# Patient Record
Sex: Male | Born: 1955 | Race: Black or African American | Hispanic: No | Marital: Married | Smoking: Never smoker
Health system: Southern US, Community
[De-identification: ages and names within clinical notes are randomized; demographics above are authoritative.]

## PROBLEM LIST (undated history)

## (undated) DIAGNOSIS — E7800 Pure hypercholesterolemia, unspecified: Secondary | ICD-10-CM

## (undated) DIAGNOSIS — F409 Phobic anxiety disorder, unspecified: Secondary | ICD-10-CM

## (undated) DIAGNOSIS — Z87442 Personal history of urinary calculi: Secondary | ICD-10-CM

## (undated) DIAGNOSIS — G709 Myoneural disorder, unspecified: Secondary | ICD-10-CM

## (undated) DIAGNOSIS — N529 Male erectile dysfunction, unspecified: Secondary | ICD-10-CM

## (undated) DIAGNOSIS — K219 Gastro-esophageal reflux disease without esophagitis: Secondary | ICD-10-CM

## (undated) DIAGNOSIS — I10 Essential (primary) hypertension: Secondary | ICD-10-CM

## (undated) DIAGNOSIS — G2581 Restless legs syndrome: Secondary | ICD-10-CM

## (undated) DIAGNOSIS — G4700 Insomnia, unspecified: Secondary | ICD-10-CM

## (undated) HISTORY — DX: Restless legs syndrome: G25.81

## (undated) HISTORY — DX: Male erectile dysfunction, unspecified: N52.9

## (undated) HISTORY — DX: Myoneural disorder, unspecified: G70.9

## (undated) HISTORY — PX: KIDNEY STONE SURGERY: SHX686

## (undated) HISTORY — PX: COLONOSCOPY: SHX174

## (undated) HISTORY — DX: Insomnia, unspecified: G47.00

---

## 2002-11-29 ENCOUNTER — Emergency Department (HOSPITAL_COMMUNITY): Admission: EM | Admit: 2002-11-29 | Discharge: 2002-11-29 | Payer: Self-pay | Admitting: Emergency Medicine

## 2002-11-29 ENCOUNTER — Encounter: Payer: Self-pay | Admitting: Emergency Medicine

## 2002-12-30 ENCOUNTER — Ambulatory Visit (HOSPITAL_BASED_OUTPATIENT_CLINIC_OR_DEPARTMENT_OTHER): Admission: RE | Admit: 2002-12-30 | Discharge: 2002-12-30 | Payer: Self-pay | Admitting: Urology

## 2012-12-17 ENCOUNTER — Encounter (HOSPITAL_COMMUNITY): Payer: Self-pay | Admitting: *Deleted

## 2012-12-17 ENCOUNTER — Emergency Department (HOSPITAL_COMMUNITY)
Admission: EM | Admit: 2012-12-17 | Discharge: 2012-12-17 | Disposition: A | Discharge status: Home / Self Care | Payer: PRIVATE HEALTH INSURANCE | Source: Home / Self Care

## 2012-12-17 DIAGNOSIS — R059 Cough, unspecified: Secondary | ICD-10-CM

## 2012-12-17 DIAGNOSIS — J309 Allergic rhinitis, unspecified: Secondary | ICD-10-CM

## 2012-12-17 DIAGNOSIS — R0982 Postnasal drip: Secondary | ICD-10-CM

## 2012-12-17 DIAGNOSIS — R05 Cough: Secondary | ICD-10-CM

## 2012-12-17 HISTORY — DX: Essential (primary) hypertension: I10

## 2012-12-17 HISTORY — DX: Phobic anxiety disorder, unspecified: F40.9

## 2012-12-17 HISTORY — DX: Gastro-esophageal reflux disease without esophagitis: K21.9

## 2012-12-17 MED ORDER — PHENYLEPHRINE-CHLORPHEN-DM 10-4-12.5 MG/5ML PO LIQD: 5.0000 mL | ORAL | Status: DC | PRN

## 2012-12-17 NOTE — ED Notes (Addendum)
States he gets a cough when he is in air conditioning and night for past 3 weeks.  No fever.  Has had a little runny nose. No hx. of allergies.  Had slight sore throat for past 72 hrs.  No earache or headache.  Did not take his BP medication today because he has been taking cold medication.

## 2012-12-17 NOTE — ED Notes (Signed)
Pt. unable to do e-.signature due to computers being down when he was discharged.

## 2012-12-17 NOTE — Discharge Instructions (Signed)
Allergic Rhinitis Allergic rhinitis is when the mucous membranes in the nose respond to allergens. Allergens are particles in the air that cause your body to have an allergic reaction. This causes you to release allergic antibodies. Through a chain of events, these eventually cause you to release histamine into the blood stream (hence the use of antihistamines). Although meant to be protective to the body, it is this release that causes your discomfort, such as frequent sneezing, congestion and an itchy runny nose.  CAUSES  The pollen allergens may come from grasses, trees, and weeds. This is seasonal allergic rhinitis, or "hay fever." Other allergens cause year-round allergic rhinitis (perennial allergic rhinitis) such as house dust mite allergen, pet dander and mold spores.  SYMPTOMS   Nasal stuffiness (congestion).  Runny, itchy nose with sneezing and tearing of the eyes.  There is often an itching of the mouth, eyes and ears. It cannot be cured, but it can be controlled with medications. DIAGNOSIS  If you are unable to determine the offending allergen, skin or blood testing may find it. TREATMENT   Avoid the allergen.  Medications and allergy shots (immunotherapy) can help.  Hay fever may often be treated with antihistamines in pill or nasal spray forms. Antihistamines block the effects of histamine. There are over-the-counter medicines that may help with nasal congestion and swelling around the eyes. Check with your caregiver before taking or giving this medicine. If the treatment above does not work, there are many new medications your caregiver can prescribe. Stronger medications may be used if initial measures are ineffective. Desensitizing injections can be used if medications and avoidance fails. Desensitization is when a patient is given ongoing shots until the body becomes less sensitive to the allergen. Make sure you follow up with your caregiver if problems continue. SEEK MEDICAL  CARE IF:   You develop fever (more than 100.5 F (38.1 C).  You develop a cough that does not stop easily (persistent).  You have shortness of breath.  You start wheezing.  Symptoms interfere with normal daily activities. Document Released: 02/13/2001 Document Revised: 08/13/2011 Document Reviewed: 08/25/2008 ExitCare Patient Information 2014 [employer]. Cough, Adult  A cough is a reflex that helps clear your throat and airways. It can help heal the body or may be a reaction to an irritated airway. A cough may only last 2 or 3 weeks (acute) or may last more than 8 weeks (chronic).  CAUSES Acute cough:  Viral or bacterial infections. Chronic cough:  Infections.  Allergies.  Asthma.  Post-nasal drip.  Smoking.  Heartburn or acid reflux.  Some medicines.  Chronic lung problems (COPD).  Cancer. SYMPTOMS   Cough.  Fever.  Chest pain.  Increased breathing rate.  High-pitched whistling sound when breathing (wheezing).  Colored mucus that you cough up (sputum). TREATMENT   A bacterial cough may be treated with antibiotic medicine.  A viral cough must run its course and will not respond to antibiotics.  Your caregiver may recommend other treatments if you have a chronic cough. HOME CARE INSTRUCTIONS   Only take over-the-counter or prescription medicines for pain, discomfort, or fever as directed by your caregiver. Use cough suppressants only as directed by your caregiver.  Use a cold steam vaporizer or humidifier in your bedroom or home to help loosen secretions.  Sleep in a semi-upright position if your cough is worse at night.  Rest as needed.  Stop smoking if you smoke. SEEK IMMEDIATE MEDICAL CARE IF:   You have pus   in your sputum.  Your cough starts to worsen.  You cannot control your cough with suppressants and are losing sleep.  You begin coughing up blood.  You have difficulty breathing.  You develop pain which is getting worse or  is uncontrolled with medicine.  You have a fever. MAKE SURE YOU:   Understand these instructions.  Will watch your condition.  Will get help right away if you are not doing well or get worse. Document Released: 11/17/2010 Document Revised: 08/13/2011 Document Reviewed: 11/17/2010 ExitCare Patient Information 2014 [employer].  

## 2012-12-17 NOTE — ED Provider Notes (Signed)
History    CSN: [id number] Arrival date & time 12/17/12  1948  None    Chief Complaint  Patient presents with  . Cough   (Consider location/radiation/quality/duration/timing/severity/associated sxs/prior Treatment) HPI Comments: 57 year old male complaining of cough for 3 weeks. It occurs nearly exclusively while supine. He states when he sleeps in his air conditioned as well he has a mild discomfort in his throat it has some watery eyes. He complains of PND during the day. Denies headache or fever.  Patient is a 57 y.o. male presenting with cough.  Cough Associated symptoms: rhinorrhea and sore throat   Associated symptoms: no shortness of breath and no wheezing    Past Medical History  Diagnosis Date  . Hypertension   . GERD (gastroesophageal reflux disease)   . Phobic anxiety disorder     fear of driving over bridges   History reviewed. No pertinent past surgical history. Family History  Problem Relation Age of Onset  . COPD Father    History  Substance Use Topics  . Smoking status: Never Smoker   . Smokeless tobacco: Not on file  . Alcohol Use: No    Review of Systems  Constitutional: Negative.   HENT: Positive for congestion, sore throat, rhinorrhea and postnasal drip. Negative for neck pain and ear discharge.   Respiratory: Positive for cough. Negative for chest tightness, shortness of breath and wheezing.   Gastrointestinal: Negative.   Neurological: Negative.     Allergies  Review of patient's allergies indicates no known allergies.  Home Medications   Current Outpatient Rx  Name  Route  Sig  Dispense  Refill  . citalopram (CELEXA) 10 MG tablet   Oral   Take 10 mg by mouth daily.         Marland Kitchen dextromethorphan (DELSYM) 30 MG/5ML liquid   Oral   Take 60 mg by mouth as needed for cough.         Marland Kitchen LOSARTAN POTASSIUM PO   Oral   Take 25 mg by mouth daily.         . pantoprazole (PROTONIX) 20 MG tablet   Oral   Take 20 mg by mouth daily.         Marland Kitchen Phenylephrine-Chlorphen-DM 03-08-11.5 MG/5ML LIQD   Oral   Take 5 mLs by mouth every 4 (four) hours as needed.   120 mL   0    BP 161/94  Pulse 56  Temp(Src) 98 F (36.7 C) (Oral)  Resp 12  SpO2 100% Physical Exam  Nursing note and vitals reviewed. Constitutional: He is oriented to person, place, and time. He appears well-developed and well-nourished. No distress.  HENT:  Bilateral TMs are normal with exception of minor retraction of the right. Oropharynx with mild red streaking on the right lateral aspects. No swelling or exudates.  Neck: Normal range of motion. Neck supple.  Cardiovascular: Normal rate and regular rhythm.   Pulmonary/Chest: Effort normal and breath sounds normal. No respiratory distress.  Musculoskeletal: Normal range of motion. He exhibits no edema.  Lymphadenopathy:    He has no cervical adenopathy.  Neurological: He is alert and oriented to person, place, and time.  Skin: Skin is warm and dry. No rash noted.  Psychiatric: He has a normal mood and affect.    ED Course  Procedures (including critical care time) Labs Reviewed - No data to display No results found. 1. Cough   2. PND (post-nasal drip)   3. Allergic rhinitis     MDM  Cough most likely do to PND secondary to allergic rhinitis. GERD is also considered , however he is taking a PPI.  Norel Cs as directed, plenty of fluids.D/C in a stable condition  Hayden Rasmussen, NP 12/18/12 1347

## 2012-12-18 NOTE — ED Provider Notes (Signed)
Medical screening examination/treatment/procedure(s) were performed by a resident physician or non-physician practitioner and as the supervising physician I was immediately available for consultation/collaboration.  Adelynne Joerger, MD   Dequane Strahan S Joneisha Miles, MD 12/18/12 1354 

## 2015-03-31 ENCOUNTER — Ambulatory Visit (INDEPENDENT_AMBULATORY_CARE_PROVIDER_SITE_OTHER): Payer: Worker's Compensation | Admitting: Family Medicine

## 2015-03-31 ENCOUNTER — Ambulatory Visit: Payer: Worker's Compensation

## 2015-03-31 VITALS — BP 120/86 | HR 71 | Temp 98.3°F | Resp 18 | Ht 64.0 in | Wt 184.0 lb

## 2015-03-31 DIAGNOSIS — S40021A Contusion of right upper arm, initial encounter: Secondary | ICD-10-CM | POA: Diagnosis not present

## 2015-03-31 DIAGNOSIS — M79641 Pain in right hand: Secondary | ICD-10-CM

## 2015-03-31 DIAGNOSIS — M25511 Pain in right shoulder: Secondary | ICD-10-CM

## 2015-03-31 DIAGNOSIS — S62306A Unspecified fracture of fifth metacarpal bone, right hand, initial encounter for closed fracture: Secondary | ICD-10-CM | POA: Diagnosis not present

## 2015-03-31 NOTE — Patient Instructions (Signed)
It is fine to use the tylenol for pain if needed.  Please follow the restrictions. Await contact for referral for left arm pain.

## 2015-03-31 NOTE — Progress Notes (Deleted)
[facility] [address], [place] [id number] [phone]  Date:  03/31/2015   Name:  Austin Gibbs   DOB:  November 14, 1955   MRN:  [id number]  PCP:  Sissy HoffSWAYNE,DAVID W, MD    History of Present Illness:  Austin Gibbs is Gibbs 59 y.o. male patient who [facility]   There are no active problems to display for this patient.   Past Medical History  Diagnosis Date  . Hypertension   . GERD (gastroesophageal reflux disease)   . Phobic anxiety disorder     fear of driving over bridges    History reviewed. No pertinent past surgical history.  Social History  Substance Use Topics  . Smoking status: Never Smoker   . Smokeless tobacco: None  . Alcohol Use: No    Family History  Problem Relation Age of Onset  . COPD Father   . Hyperlipidemia Mother     No Known Allergies  Medication list has been reviewed and updated.  Current Outpatient Prescriptions on File Prior to Visit  Medication Sig Dispense Refill  . citalopram (CELEXA) 10 MG tablet Take 10 mg by mouth daily.    Austin Gibbs. LOSARTAN POTASSIUM PO Take 25 mg by mouth daily.    . pantoprazole (PROTONIX) 20 MG tablet Take 20 mg by mouth daily.    Austin Gibbs. Phenylephrine-Chlorphen-DM 03-08-11.5 MG/5ML LIQD Take 5 mLs by mouth every 4 (four) hours as needed. (Patient not taking: Reported on 03/31/2015) 120 mL 0   No current facility-administered medications on file prior to visit.    ROS   Physical Examination: BP 120/86 mmHg  Pulse 71  Temp(Src) 98.3 F (36.8 C) (Oral)  Resp 18  Ht 5\' 4"  (1.626 m)  Wt 184 lb (83.462 kg)  BMI 31.57 kg/m2  SpO2 98% Ideal Body Weight: Weight in (lb) to have BMI = 25: 145.3  Physical Exam   Assessment and Plan: Austin Gibbs [name] is Gibbs 59 y.o. male who is here today  There are no diagnoses linked to this encounter.  Austin PlattStephanie English, PA-C [facility] 03/31/2015 3:50 PM

## 2015-03-31 NOTE — Progress Notes (Signed)
Austin NeighboursVictor A Gibbs [birth year] 59 y.o.   Chief Complaint  Patient presents with  . Hand Injury    Rt. onset 02-23-15/ [employer]  . Arm Injury    Date of Injury: 02/23/2015  History of Present Illness:  Presents for evaluation of work-related complaint.  Injured over a month ago-  Patient was walking with a sign at work, when he tripped over a bush and fell, landing on the right ulnar side of hand and shoulder.  He said that swelling occurred at the right hypothenar area initially, and he had pain at his right shoulder.  He had numbness and tingling down the arm which subsided within days.  He has used ice and also took ibuprofen initially for pain, which helped.  Now he feels that he continues to have pain at the right shoulder and hand.  It is aggravated when people squeeze his hand for shaking and when he attempts open a bottle.     ROS ROS otherwise unremarkable unless listed above.    No Known Allergies   Current medications reviewed and updated. Past medical history, family history, social history have been reviewed and updated.   Physical Exam  Constitutional: He is oriented to person, place, and time and well-developed, well-nourished, and in no distress. No distress.  Eyes: Left eye exhibits no discharge.  Pulmonary/Chest: Effort normal. No respiratory distress.  Musculoskeletal: He exhibits no edema.       Right shoulder: He exhibits decreased range of motion (difficulty with complete external range of motion.) and pain (Pain with external and internal rotation.). He exhibits no bony tenderness and no swelling.       Cervical back: Normal. He exhibits no bony tenderness.  Negative Hawkins.  Positive Neer's at 2 oclock.  Negative can test.  Pain incited at the fifth mcp with squeezing hand.  5th finger 3/5 strength.  Negative dequervain's.  Neurological: He is alert and oriented to person, place, and time.  Skin: Skin is warm and dry. He is not diaphoretic. No  erythema.  Psychiatric: Mood and affect normal.   [facility] reading (PRIMARY) by  Dr. Patsy Lageropland, Right Hand: Right metacarpal base non-displaced fracture at the 5th finger.   Right Humerus: Negative Right Shoulder: Negative Right hand: fracture of 5th MC base   Assessment and Plan: 59 year old male is here today for right shoulder pain and hand pain.   He does have a fracture of the right 5th MC base,  It is slightly displaced. Will refer to orthopedics for definitive treatment; at this time suspect it may be too late for pinning of this injury.   Volar/dorsal splint placed. Ortho consult appreciated Restrictions placed (see letter)  Closed fracture of fifth metacarpal bone of right hand, initial encounter - Plan: Ambulatory referral to Orthopedic Surgery  Right hand pain - Plan: DG Hand Complete Right, DG Humerus Right, DG Shoulder Right, Ambulatory referral to Orthopedic Surgery  Right shoulder pain - Plan: DG Hand Complete Right, DG Humerus Right, DG Shoulder Right  Arm contusion, right, initial encounter  Austin PlattStephanie English, PA-C [facility] 10/27/20165:52 PM

## 2018-08-26 ENCOUNTER — Emergency Department (HOSPITAL_COMMUNITY): Payer: BLUE CROSS/BLUE SHIELD

## 2018-08-26 ENCOUNTER — Encounter (HOSPITAL_COMMUNITY): Payer: Self-pay | Admitting: Emergency Medicine

## 2018-08-26 ENCOUNTER — Observation Stay (HOSPITAL_COMMUNITY)
Admission: EM | Admit: 2018-08-26 | Discharge: 2018-08-27 | Disposition: A | Discharge status: Home / Self Care | Payer: BLUE CROSS/BLUE SHIELD | Attending: Internal Medicine | Admitting: Internal Medicine

## 2018-08-26 ENCOUNTER — Other Ambulatory Visit: Payer: Self-pay

## 2018-08-26 DIAGNOSIS — E7800 Pure hypercholesterolemia, unspecified: Secondary | ICD-10-CM

## 2018-08-26 DIAGNOSIS — I10 Essential (primary) hypertension: Secondary | ICD-10-CM | POA: Diagnosis not present

## 2018-08-26 DIAGNOSIS — R0789 Other chest pain: Secondary | ICD-10-CM | POA: Diagnosis not present

## 2018-08-26 DIAGNOSIS — Z79899 Other long term (current) drug therapy: Secondary | ICD-10-CM | POA: Diagnosis not present

## 2018-08-26 DIAGNOSIS — Z7982 Long term (current) use of aspirin: Secondary | ICD-10-CM | POA: Diagnosis not present

## 2018-08-26 DIAGNOSIS — K219 Gastro-esophageal reflux disease without esophagitis: Secondary | ICD-10-CM | POA: Insufficient documentation

## 2018-08-26 DIAGNOSIS — E785 Hyperlipidemia, unspecified: Secondary | ICD-10-CM

## 2018-08-26 DIAGNOSIS — E669 Obesity, unspecified: Secondary | ICD-10-CM | POA: Insufficient documentation

## 2018-08-26 DIAGNOSIS — F419 Anxiety disorder, unspecified: Secondary | ICD-10-CM | POA: Diagnosis not present

## 2018-08-26 DIAGNOSIS — R079 Chest pain, unspecified: Secondary | ICD-10-CM | POA: Diagnosis present

## 2018-08-26 HISTORY — DX: Pure hypercholesterolemia, unspecified: E78.00

## 2018-08-26 LAB — BASIC METABOLIC PANEL
Anion gap: 9 (ref 5–15)
BUN: 7 mg/dL — ABNORMAL LOW (ref 8–23)
CO2: 25 mmol/L (ref 22–32)
Calcium: 8.8 mg/dL — ABNORMAL LOW (ref 8.9–10.3)
Chloride: 106 mmol/L (ref 98–111)
Creatinine, Ser: 1.09 mg/dL (ref 0.61–1.24)
GFR calc Af Amer: >60 mL/min (ref >60)
GFR calc non Af Amer: >60 mL/min (ref >60)
Glucose, Bld: 105 mg/dL — ABNORMAL HIGH (ref 70–99)
Potassium: 4.1 mmol/L (ref 3.5–5.1)
Sodium: 140 mmol/L (ref 135–145)

## 2018-08-26 LAB — I-STAT TROPONIN, ED: Troponin i, poc: 0 ng/mL (ref 0.00–0.08)

## 2018-08-26 LAB — CBC
HCT: 38.7 % — ABNORMAL LOW (ref 39.0–52.0)
Hemoglobin: 12 g/dL — ABNORMAL LOW (ref 13.0–17.0)
MCH: 28.4 pg (ref 26.0–34.0)
MCHC: 31 g/dL (ref 30.0–36.0)
MCV: 91.7 fL (ref 80.0–100.0)
Platelets: 315 10*3/uL (ref 150–400)
RBC: 4.22 MIL/uL (ref 4.22–5.81)
RDW: 13.6 % (ref 11.5–15.5)
WBC: 6.6 10*3/uL (ref 4.0–10.5)
nRBC: 0 % (ref 0.0–0.2)

## 2018-08-26 LAB — D-DIMER, QUANTITATIVE: D-Dimer, Quant: 0.36 ug/mL-FEU (ref 0.00–0.50)

## 2018-08-26 NOTE — ED Notes (Signed)
Pt's sps. Elmo Putt [phone]. Pls contact for update and D/C

## 2018-08-26 NOTE — H&P (Signed)
History and Physical    KAADEN BENCOSME KZL:[id number] DOB: 01-02-1956 DOA: 08/26/2018  PCP: Tally Joe, MD  Patient coming from: Home  I have personally briefly reviewed patient's old medical records in [facility]  Chief Complaint: Chest pain  HPI: Austin Gibbs is a 63 y.o. male with medical history significant for hypertension, hyperlipidemia, GERD, anxiety, and obesity who presents to the ED for evaluation of chest pain.  Patient reports first noticing infrequent episodes of tight chest pain across her chest about 1 month ago, occurring only with walking up a flight of stairs.  He has also felt intermittent palpitations.  Over the last 1-2 weeks he has noticed more frequent chest tightness across his chest when going up a flight of stairs or walking uphill.  He has had radiation to his left shoulder on occasion.  He had an episode of lightheadedness without syncope.  He denies any associated dyspnea, diaphoresis, nausea, vomiting, abdominal pain.    He reports a chronic dry cough which is unchanged.  He reports a history of heartburn/reflux which is different than his current symptoms.  He says he was scheduled for an upper endoscopy to evaluate for symptoms of dysphasia and sensation of food getting stuck in his chest, however he canceled the procedure.  He says the symptoms are different than his chest pain symptoms.  He denies any exacerbation of chest pain around mealtime.  He denies any heavy physical activity involving pushing/pulling with his arms or heavy lifting.  He is a never smoker.  He reports occasional alcohol on the weekends.  He denies any illicit drug use.  He says both of his grandparents died in their 42s from heart attacks.  ED Course:  Initial vitals showed BP 168/90, pulse 70, RR 16, temp 98.1 Fahrenheit, SPO2 100% on room air.  Initial labs are notable for WBC 6.6, hemoglobin 12.0, platelets 315, sodium 140, potassium 4.1, BUN 7, creatinine 1.09, serum  glucose 105, d-dimer 0.36, i-STAT troponin 0.00.  2 view chest x-ray showed clear lung fields without focal consolidation, effusion, or edema.  EKG showed normal sinus rhythm without acute ischemic changes.  The hospital service was consulted to admit for chest pain rule out.  Review of Systems: As per HPI otherwise 10 point review of systems negative.    Past Medical History:  Diagnosis Date  . GERD (gastroesophageal reflux disease)   . Hypercholesteremia   . Hypertension   . Phobic anxiety disorder    fear of driving over bridges    History reviewed. No pertinent surgical history.   reports that he has never smoked. He has never used smokeless tobacco. He reports current alcohol use. He reports that he does not use drugs.  No Known Allergies  Family History  Problem Relation Age of Onset  . COPD Father   . Hyperlipidemia Mother   . Heart attack Maternal Grandfather        Died in 59s from heart attack  . Heart attack Paternal Grandfather        Died in 10s from heart attack     Prior to Admission medications   Medication Sig Start Date End Date Taking? Authorizing Provider  amLODipine (NORVASC) 5 MG tablet Take 5 mg by mouth daily. 06/10/18  Yes [provider]  atorvastatin (LIPITOR) 20 MG tablet Take 20 mg by mouth daily. 06/10/18  Yes [provider]  citalopram (CELEXA) 20 MG tablet Take 20 mg by mouth daily.    Yes  [provider]  losartan (COZAAR) 100 MG tablet Take 100 mg by mouth daily.    Yes [provider]  Phenylephrine-Chlorphen-DM 03-08-11.5 MG/5ML LIQD Take 5 mLs by mouth every 4 (four) hours as needed. Patient not taking: Reported on 03/31/2015 12/17/12   Hayden Rasmussen, NP    Physical Exam: Vitals:   08/26/18 2103 08/26/18 2104 08/26/18 2145  BP: (!) 168/90  (!) 147/81  Pulse: 68  66  Resp: 16  15  Temp: 98.1 F (36.7 C)    TempSrc: Oral    SpO2: 100%  98%  Weight:  88.5 kg   Height:  5\' 5"  (1.651 m)      Constitutional: Sitting up in bed, NAD, calm, comfortable Eyes: PERRL, lids and conjunctivae normal ENMT: Mucous membranes are moist. Posterior pharynx clear of any exudate or lesions.Normal dentition.  Neck: normal, supple, no masses. Respiratory: clear to auscultation bilaterally, no wheezing, no crackles. Normal respiratory effort. No accessory muscle use.  Cardiovascular: Regular rate and rhythm, no murmurs / rubs / gallops. No extremity edema.  Abdomen: no tenderness, no masses palpated. No hepatosplenomegaly. Bowel sounds positive.  Musculoskeletal: No reproducible chest tenderness on palpation.  No clubbing / cyanosis. No joint deformity upper and lower extremities. Good ROM, no contractures. Normal muscle tone.  Skin: no rashes, lesions, ulcers. No induration Neurologic: CN 2-12 grossly intact. Sensation intact, Strength 5/5 in all 4.  Psychiatric: Normal judgment and insight. Alert and oriented x 3. Normal mood.    Labs on Admission: I have personally reviewed following labs and imaging studies  CBC: Recent Labs  Lab 08/26/18 2112  WBC 6.6  HGB 12.0*  HCT 38.7*  MCV 91.7  PLT 315   Basic Metabolic Panel: Recent Labs  Lab 08/26/18 2112  NA 140  K 4.1  CL 106  CO2 25  GLUCOSE 105*  BUN 7*  CREATININE 1.09  CALCIUM 8.8*   GFR: Estimated Creatinine Clearance: 72.8 mL/min (by C-G formula based on SCr of 1.09 mg/dL). Liver Function Tests: No results for input(s): AST, ALT, ALKPHOS, BILITOT, PROT, ALBUMIN in the last 168 hours. No results for input(s): LIPASE, AMYLASE in the last 168 hours. No results for input(s): AMMONIA in the last 168 hours. Coagulation Profile: No results for input(s): INR, PROTIME in the last 168 hours. Cardiac Enzymes: No results for input(s): CKTOTAL, CKMB, CKMBINDEX, TROPONINI in the last 168 hours. BNP (last 3 results) No results for input(s): PROBNP in the last 8760 hours. HbA1C: No results for input(s): HGBA1C in the last 72 hours.  CBG: No results for input(s): GLUCAP in the last 168 hours. Lipid Profile: No results for input(s): CHOL, HDL, LDLCALC, TRIG, CHOLHDL, LDLDIRECT in the last 72 hours. Thyroid Function Tests: No results for input(s): TSH, T4TOTAL, FREET4, T3FREE, THYROIDAB in the last 72 hours. Anemia Panel: No results for input(s): VITAMINB12, FOLATE, FERRITIN, TIBC, IRON, RETICCTPCT in the last 72 hours. Urine analysis: No results found for: COLORURINE, APPEARANCEUR, LABSPEC, PHURINE, GLUCOSEU, HGBUR, BILIRUBINUR, KETONESUR, PROTEINUR, UROBILINOGEN, NITRITE, LEUKOCYTESUR  Radiological Exams on Admission: Dg Chest 2 View  Result Date: 08/26/2018 CLINICAL DATA:  Chest pain. Cough for 1 week. EXAM: CHEST - 2 VIEW COMPARISON:  None. FINDINGS: The cardiomediastinal contours are normal. The lungs are clear. Pulmonary vasculature is normal. No consolidation, pleural effusion, or pneumothorax. No acute osseous abnormalities are seen. IMPRESSION: No acute chest findings. Electronically Signed   By: Narda Rutherford M.D.   On: 08/26/2018 21:37    EKG: Independently reviewed.  Normal  sinus rhythm, no acute ischemic changes.  Assessment/Plan Principal Problem:   Chest pain Active Problems:   Hypertension   Hypercholesteremia  Darvin NeighboursVictor A Leatham is a 63 y.o. male with medical history significant for hypertension, hyperlipidemia, GERD, anxiety, and obesity who is admitted for chest pain rule out.   Chest pain: Patient with chest pain symptoms with anginal type features.  He has risk factors in HTN, HLD, obesity, and age and may benefit from further evaluation with stress testing.. -Admit to telemetry, repeat EKG in a.m. -Obtain echocardiogram -Start aspirin 81 daily -Continue atorvastatin 20 mg daily -Check A1c, lipid panel -Will make n.p.o.  Hypertension: BP is elevated on admission. -Continue home amlodipine and losartan  Hyperlipidemia: -Continue atorvastatin  GERD: -Continue pantoprazole   Anxiety: -Continue Celexa  DVT prophylaxis: Lovenox Code Status: Full code, confirmed with patient Family Communication: None present at bedside on admission Disposition Plan: Likely discharge to home pending chest pain rule out and further work-up Consults called: None Admission status: Observation   Darreld McleanVishal Conrad Zajkowski MD [facility] Pager [phone]  If 7PM-7AM, please contact night-coverage [email or web address]  08/27/2018, 12:22 AM

## 2018-08-26 NOTE — ED Provider Notes (Signed)
[facility] EMERGENCY DEPARTMENT Provider Note   CSN: [id number] Arrival date & time: 08/26/18  2044    History   Chief Complaint Chief Complaint  Patient presents with  . Chest Pain  . Cough    HPI Austin Gibbs is a 63 y.o. male.     Austin Gibbs is a 63 y.o. male with a history of hypertension, hyperlipidemia, GERD, obesity, who presents to the emergency department for evaluation of chest pain.  Patient reports that for the past week he has been experiencing intermittent chest pains.  He describes this as a tightness and pressure across the front of his chest.  Pain is made worse with exertion when he tries to walk uphill or go upstairs.  Pain occasionally radiates to the left arm.  Pain is not worse with deep breath.  Patient does report some mild shortness of breath with exertion.  No lightheadedness or syncope.  No lower extremity swelling or tenderness.  No associated nausea, vomiting or diaphoresis.  No abdominal pain.  No other aggravating or alleviating factors.  Has had an occasional nonproductive cough, no fevers or chills.  No prior history of cardiac events, is not followed by a cardiologist.  No smoking history, denies alcohol or drug use.     Past Medical History:  Diagnosis Date  . GERD (gastroesophageal reflux disease)   . Hypertension   . 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   No past surgical history on file.      Home Medications    Prior to Admission medications   Medication Sig Start Date End Date Taking? Authorizing Provider  citalopram (CELEXA) 10 MG tablet Take 10 mg by mouth daily.    [provider]  LOSARTAN POTASSIUM PO Take 25 mg by mouth daily.    [provider]  pantoprazole (PROTONIX) 20 MG tablet Take 20 mg by mouth daily.    [provider]  Phenylephrine-Chlorphen-DM 03-08-11.5 MG/5ML LIQD Take 5 mLs  by mouth every 4 (four) hours as needed. Patient not taking: Reported on 03/31/2015 12/17/12   Hayden Rasmussen, NP    Family History Family History  Problem Relation Age of Onset  . COPD Father   . Hyperlipidemia Mother     Social History Social History   Tobacco Use  . Smoking status: Never Smoker  Substance Use Topics  . Alcohol use: No  . Drug use: No     Allergies   Patient has no known allergies.   Review of Systems Review of Systems  Constitutional: Negative for chills and fever.  HENT: Negative.   Eyes: Negative for visual disturbance.  Respiratory: Positive for cough and shortness of breath. Negative for chest tightness and wheezing.   Cardiovascular: Positive for chest pain. Negative for palpitations and leg swelling.  Gastrointestinal: Negative for abdominal pain, nausea and vomiting.  Genitourinary: Negative for dysuria, flank pain and frequency.  Musculoskeletal: Negative for arthralgias, back pain and myalgias.  Skin: Negative for color change and rash.  Neurological: Positive for light-headedness. Negative for dizziness, syncope and headaches.     Physical Exam Updated Vital Signs BP (!) 168/90 (BP Location: Right Arm)   Pulse 68   Temp 98.1 F (36.7 C) (Oral)   Resp 16   Ht 5\' 5"  (1.651 m)   Wt 88.5 kg   SpO2 100%   BMI 32.45 kg/m  Physical Exam Vitals signs and nursing note reviewed.  Constitutional:      General: He is not in acute distress.    Appearance: He is well-developed. He is not diaphoretic.  HENT:     Head: Normocephalic and atraumatic.  Eyes:     General:        Right eye: No discharge.        Left eye: No discharge.     Pupils: Pupils are equal, round, and reactive to light.  Neck:     Musculoskeletal: Neck supple.  Cardiovascular:     Rate and Rhythm: Normal rate and regular rhythm.     Pulses:          Radial pulses are 2+ on the right side and 2+ on the left side.       Dorsalis pedis pulses are 2+ on the right side  and 2+ on the left side.     Heart sounds: Normal heart sounds. No murmur. No friction rub. No gallop.   Pulmonary:     Effort: Pulmonary effort is normal. No respiratory distress.     Breath sounds: Normal breath sounds. No wheezing or rales.     Comments: Respirations equal and unlabored, patient able to speak in full sentences, lungs clear to auscultation bilaterally Chest:     Chest wall: No tenderness.     Comments: Chest pain is not reproducible with palpation Abdominal:     General: Bowel sounds are normal. There is no distension.     Palpations: Abdomen is soft. There is no mass.     Tenderness: There is no abdominal tenderness. There is no guarding.     Comments: Abdomen soft, nondistended, nontender to palpation in all quadrants without guarding or peritoneal signs  Musculoskeletal:        General: No deformity.  Skin:    General: Skin is warm and dry.     Capillary Refill: Capillary refill takes less than 2 seconds.  Neurological:     Mental Status: He is alert and oriented to person, place, and time.     Coordination: Coordination normal.     Comments: Speech is clear, able to follow commands CN III-XII intact Normal strength in upper and lower extremities bilaterally including dorsiflexion and plantar flexion, strong and equal grip strength Sensation normal to light and sharp touch Moves extremities without ataxia, coordination intact  Psychiatric:        Mood and Affect: Mood normal.        Behavior: Behavior normal.      ED Treatments / Results  Labs (all labs ordered are listed, but only abnormal results are displayed) Labs Reviewed  BASIC METABOLIC PANEL - Abnormal; Notable for the following components:      Result Value   Glucose, Bld 105 (*)    BUN 7 (*)    Calcium 8.8 (*)    All other components within normal limits  CBC - Abnormal; Notable for the following components:   Hemoglobin 12.0 (*)    HCT 38.7 (*)    All other components within normal  limits  D-DIMER, QUANTITATIVE (NOT AT [facility])  I-STAT TROPONIN, ED    EKG EKG Interpretation  Date/Time:  Tuesday August 26 2018 20:59:18 EDT Ventricular Rate:  68 PR Interval:    QRS Duration: 83 QT Interval:  397 QTC Calculation: 423 R Axis:   45 Text Interpretation:  Sinus rhythm Confirmed by Virgina Norfolk [phone]) on 08/26/2018 11:21:58 PM   Radiology Dg  Chest 2 View  Result Date: 08/26/2018 CLINICAL DATA:  Chest pain. Cough for 1 week. EXAM: CHEST - 2 VIEW COMPARISON:  None. FINDINGS: The cardiomediastinal contours are normal. The lungs are clear. Pulmonary vasculature is normal. No consolidation, pleural effusion, or pneumothorax. No acute osseous abnormalities are seen. IMPRESSION: No acute chest findings. Electronically Signed   By: Narda Rutherford M.D.   On: 08/26/2018 21:37    Procedures Procedures (including critical care time)  Medications Ordered in ED Medications - No data to display   Initial Impression / Assessment and Plan / ED Course  I have reviewed the triage vital signs and the nursing notes.  Pertinent labs & imaging results that were available during my care of the patient were reviewed by me and considered in my medical decision making (see chart for details).  Patient presents with 1 week of intermittent episodes of chest pain which have been increasing in frequency throughout the week, pain is made worse with exertion, some radiation to the left arm.  Concerning for potential cardiac etiology.  Initial EKG shows sinus rhythm without concerning ischemic changes and troponin is negative.  Patient low risk for PE, ruled out with negative d-dimer.  Chest x-ray shows no evidence of pneumonia or other active cardiopulmonary disease.  Presentation not suggestive of dissection.  Patient is currently chest pain-free.  Heart pathway score of 4, initial work-up is reassuring but patient has multiple cardiac risk factors and angina is exertional and has been occurring  with increasing frequency throughout the week concerning for cardiac etiology.  Feel patient will require admission for chest pain rule out.  Will discuss with hospitalist for admission.  As discussed with Dr. Allena Katz who will see and admit the patient.  Final Clinical Impressions(s) / ED Diagnoses   Final diagnoses:  Exertional chest pain    ED Discharge Orders    None       Legrand Rams 08/28/18 0012    Virgina Norfolk, DO 08/28/18 [phone]

## 2018-08-26 NOTE — ED Notes (Signed)
Patient transported to X-ray 

## 2018-08-26 NOTE — ED Triage Notes (Signed)
C/O of chest pain / tightness stretching across pt's chest X 1 week without raditaion. Also reports cough with some shortness of breath with nasal congestion X 2 weeks.

## 2018-08-27 ENCOUNTER — Observation Stay (HOSPITAL_BASED_OUTPATIENT_CLINIC_OR_DEPARTMENT_OTHER): Payer: BLUE CROSS/BLUE SHIELD

## 2018-08-27 ENCOUNTER — Encounter (HOSPITAL_COMMUNITY): Payer: Self-pay | Admitting: Internal Medicine

## 2018-08-27 DIAGNOSIS — R079 Chest pain, unspecified: Secondary | ICD-10-CM | POA: Diagnosis not present

## 2018-08-27 DIAGNOSIS — E669 Obesity, unspecified: Secondary | ICD-10-CM | POA: Diagnosis not present

## 2018-08-27 DIAGNOSIS — I10 Essential (primary) hypertension: Secondary | ICD-10-CM

## 2018-08-27 DIAGNOSIS — E7800 Pure hypercholesterolemia, unspecified: Secondary | ICD-10-CM | POA: Diagnosis not present

## 2018-08-27 DIAGNOSIS — R0789 Other chest pain: Secondary | ICD-10-CM | POA: Diagnosis not present

## 2018-08-27 LAB — CBC
HCT: 39.1 % (ref 39.0–52.0)
Hemoglobin: 12.5 g/dL — ABNORMAL LOW (ref 13.0–17.0)
MCH: 28.7 pg (ref 26.0–34.0)
MCHC: 32 g/dL (ref 30.0–36.0)
MCV: 89.7 fL (ref 80.0–100.0)
Platelets: 327 10*3/uL (ref 150–400)
RBC: 4.36 MIL/uL (ref 4.22–5.81)
RDW: 13.7 % (ref 11.5–15.5)
WBC: 6.3 10*3/uL (ref 4.0–10.5)
nRBC: 0 % (ref 0.0–0.2)

## 2018-08-27 LAB — BASIC METABOLIC PANEL
Anion gap: 8 (ref 5–15)
BUN: 6 mg/dL — ABNORMAL LOW (ref 8–23)
CO2: 26 mmol/L (ref 22–32)
Calcium: 8.4 mg/dL — ABNORMAL LOW (ref 8.9–10.3)
Chloride: 107 mmol/L (ref 98–111)
Creatinine, Ser: 0.97 mg/dL (ref 0.61–1.24)
GFR calc Af Amer: >60 mL/min (ref >60)
GFR calc non Af Amer: >60 mL/min (ref >60)
Glucose, Bld: 103 mg/dL — ABNORMAL HIGH (ref 70–99)
Potassium: 3.8 mmol/L (ref 3.5–5.1)
Sodium: 141 mmol/L (ref 135–145)

## 2018-08-27 LAB — TROPONIN I
Troponin I: <0.03 ng/mL (ref <0.03)
Troponin I: <0.03 ng/mL (ref <0.03)
Troponin I: <0.03 ng/mL (ref <0.03)

## 2018-08-27 LAB — LIPID PANEL
Cholesterol: 123 mg/dL (ref 0–200)
HDL: 42 mg/dL (ref >40)
LDL Cholesterol: 62 mg/dL (ref 0–99)
Total CHOL/HDL Ratio: 2.9 RATIO
Triglycerides: 96 mg/dL (ref <150)
VLDL: 19 mg/dL (ref 0–40)

## 2018-08-27 LAB — ECHOCARDIOGRAM COMPLETE
Height: 66 in
Weight: 3027.2 oz

## 2018-08-27 LAB — HIV ANTIBODY (ROUTINE TESTING W REFLEX): HIV Screen 4th Generation wRfx: NONREACTIVE

## 2018-08-27 LAB — HEMOGLOBIN A1C
Hgb A1c MFr Bld: 5.7 % — ABNORMAL HIGH (ref 4.8–5.6)
Mean Plasma Glucose: 116.89 mg/dL

## 2018-08-27 MED ORDER — ACETAMINOPHEN 325 MG PO TABS: 650.0000 mg | ORAL_TABLET | ORAL | Status: DC | PRN

## 2018-08-27 MED ORDER — ASPIRIN 81 MG PO TBEC: 81.0000 mg | DELAYED_RELEASE_TABLET | Freq: Every day | ORAL | Status: DC

## 2018-08-27 MED ORDER — ASPIRIN EC 81 MG PO TBEC
81.0000 mg | DELAYED_RELEASE_TABLET | Freq: Every day | ORAL | Status: DC
  Administered 2018-08-27 (×2): 81 mg via ORAL
  Filled 2018-08-27 (×2): qty 1

## 2018-08-27 MED ORDER — ONDANSETRON HCL 4 MG/2ML IJ SOLN: 4.0000 mg | Freq: Four times a day (QID) | INTRAMUSCULAR | Status: DC | PRN

## 2018-08-27 MED ORDER — PANTOPRAZOLE SODIUM 20 MG PO TBEC
20.0000 mg | DELAYED_RELEASE_TABLET | Freq: Every day | ORAL | Status: DC
  Administered 2018-08-27: 20 mg via ORAL
  Filled 2018-08-27: qty 1

## 2018-08-27 MED ORDER — ATORVASTATIN CALCIUM 10 MG PO TABS
20.0000 mg | ORAL_TABLET | Freq: Every day | ORAL | Status: DC
  Administered 2018-08-27: 20 mg via ORAL
  Filled 2018-08-27: qty 2

## 2018-08-27 MED ORDER — CITALOPRAM HYDROBROMIDE 20 MG PO TABS
20.0000 mg | ORAL_TABLET | Freq: Every day | ORAL | Status: DC
  Administered 2018-08-27: 20 mg via ORAL
  Filled 2018-08-27: qty 1

## 2018-08-27 MED ORDER — AMLODIPINE BESYLATE 2.5 MG PO TABS
5.0000 mg | ORAL_TABLET | Freq: Every day | ORAL | Status: DC
  Administered 2018-08-27: 5 mg via ORAL
  Filled 2018-08-27: qty 2

## 2018-08-27 MED ORDER — LOSARTAN POTASSIUM 50 MG PO TABS
100.0000 mg | ORAL_TABLET | Freq: Every day | ORAL | Status: DC
  Administered 2018-08-27: 100 mg via ORAL
  Filled 2018-08-27: qty 2

## 2018-08-27 MED ORDER — ENOXAPARIN SODIUM 40 MG/0.4ML ~~LOC~~ SOLN
40.0000 mg | SUBCUTANEOUS | Status: DC
  Administered 2018-08-27: 40 mg via SUBCUTANEOUS
  Filled 2018-08-27: qty 0.4

## 2018-08-27 NOTE — Progress Notes (Addendum)
[facility] PROGRESS NOTE    Progress Note  Austin Gibbs  GHW:[id number] DOB: [birth year] DOA: 08/26/2018 PCP: Tally Joe, MD     Brief Narrative:   Austin Gibbs is an 63 y.o. male past medical history significant of hypertension hyperlipidemia GERD anxiety and obesity who presents to the ED for evaluation of chest pain.  Has felt frequent chest pain when he is going up a flight of stairs or walking uphill radiation to the left shoulder.  Assessment/Plan:   Chest pain; Atypical chest pain with multiple risk factors. With no events on telemetry, cardiac biomarkers have remained negative. HDL greater than 40 LDL less than 70. Twelve-lead EKG shows sinus rhythm normal axis no T wave abnormalities. D-dimer was 0.36. Pain is reproducible by palpation 2D echo is pending. We will discuss with cardiology for stress as an outpatient. Continue Lipitor  Essential Hypertension We will continue current regimen.  Hypercholesteremia Continue statins.   DVT prophylaxis: lovenox Family Communication:none Disposition Plan/Barrier to D/C: home today Code Status:     Code Status Orders  (From admission, onward)         Start     Ordered   08/26/18 2359  Full code  Continuous     08/27/18 0001        Code Status History    This patient has a current code status but no historical code status.        IV Access:    Peripheral IV   Procedures and diagnostic studies:   Dg Chest 2 View  Result Date: 08/26/2018 CLINICAL DATA:  Chest pain. Cough for 1 week. EXAM: CHEST - 2 VIEW COMPARISON:  None. FINDINGS: The cardiomediastinal contours are normal. The lungs are clear. Pulmonary vasculature is normal. No consolidation, pleural effusion, or pneumothorax. No acute osseous abnormalities are seen. IMPRESSION: No acute chest findings. Electronically Signed   By: Narda Rutherford M.D.   On: 08/26/2018 21:37     Medical Consultants:    None.  Anti-Infectives:      Subjective:    Austin Gibbs relates he currently has no chest pain.  He had it yesterday night with moving and turning in the bed  Objective:    Vitals:   08/26/18 2104 08/26/18 2145 08/27/18 0121 08/27/18 0409  BP:  (!) 147/81 (!) 148/86 133/87  Pulse:  66 66 60  Resp:  15 18 18   Temp:   98 F (36.7 C) 98.7 F (37.1 C)  TempSrc:   Oral Oral  SpO2:  98% 96% 100%  Weight: 88.5 kg  85.8 kg   Height: 5\' 5"  (1.651 m)  5\' 6"  (1.676 m)    No intake or output data in the 24 hours ending 08/27/18 0716 Filed Weights   08/26/18 2104 08/27/18 0121  Weight: 88.5 kg 85.8 kg    Exam: General exam: In no acute distress. Respiratory system: Good air movement and clear to auscultation. Cardiovascular system: Pain is reproducible by palpation along the fifth intercostal space, regular rate and rhythm with positive S1-S2 no rubs murmurs or gallops..  Gastrointestinal system: Abdomen is nondistended, soft and nontender.  Central nervous system: Alert and oriented. No focal neurological deficits. Extremities: No pedal edema. Skin: No rashes, lesions or ulcers Psychiatry: Judgement and insight appear normal. Mood & affect appropriate.    Data Reviewed:    Labs: Basic Metabolic Panel: Recent Labs  Lab 08/26/18 2112  NA 140  K 4.1  CL 106  CO2 25  GLUCOSE  105*  BUN 7*  CREATININE 1.09  CALCIUM 8.8*   GFR Estimated Creatinine Clearance: 73.1 mL/min (by C-G formula based on SCr of 1.09 mg/dL). Liver Function Tests: No results for input(s): AST, ALT, ALKPHOS, BILITOT, PROT, ALBUMIN in the last 168 hours. No results for input(s): LIPASE, AMYLASE in the last 168 hours. No results for input(s): AMMONIA in the last 168 hours. Coagulation profile No results for input(s): INR, PROTIME in the last 168 hours.  CBC: Recent Labs  Lab 08/26/18 2112  WBC 6.6  HGB 12.0*  HCT 38.7*  MCV 91.7  PLT 315   Cardiac Enzymes: Recent Labs  Lab 08/27/18 0226  TROPONINI <0.03    BNP (last 3 results) No results for input(s): PROBNP in the last 8760 hours. CBG: No results for input(s): GLUCAP in the last 168 hours. D-Dimer: Recent Labs    08/26/18 2112  DDIMER 0.36   Hgb A1c: Recent Labs    08/26/18 2112  HGBA1C 5.7*   Lipid Profile: Recent Labs    08/27/18 0226  CHOL 123  HDL 42  LDLCALC 62  TRIG 96  CHOLHDL 2.9   Thyroid function studies: No results for input(s): TSH, T4TOTAL, T3FREE, THYROIDAB in the last 72 hours.  Invalid input(s): FREET3 Anemia work up: No results for input(s): VITAMINB12, FOLATE, FERRITIN, TIBC, IRON, RETICCTPCT in the last 72 hours. Sepsis Labs: Recent Labs  Lab 08/26/18 2112  WBC 6.6   Microbiology No results found for this or any previous visit (from the past 240 hour(s)).   Medications:   . amLODipine  5 mg Oral Daily  . aspirin EC  81 mg Oral Daily  . atorvastatin  20 mg Oral Daily  . citalopram  20 mg Oral Daily  . enoxaparin (LOVENOX) injection  40 mg Subcutaneous Q24H  . losartan  100 mg Oral Daily  . pantoprazole  20 mg Oral Daily   Continuous Infusions:    LOS: 0 days   Marinda Elk  [facility]  08/27/2018, 7:16 AM

## 2018-08-27 NOTE — Progress Notes (Signed)
  Echocardiogram 2D Echocardiogram has been performed.  Austin Gibbs 08/27/2018, 8:44 AM

## 2018-08-27 NOTE — ED Notes (Signed)
ED TO INPATIENT HANDOFF REPORT  ED Nurse Name and Phone #:  Romeo Apple [phone]  S Name/Age/Gender Austin Gibbs 63 y.o. male Room/Bed: 029C/029C  Code Status   Code Status: Full Code  Home/SNF/Other Home Patient oriented to: self, place, time and situation Is this baseline? Yes   Triage Complete: Triage complete  Chief Complaint chest pain  Triage Note C/O of chest pain / tightness stretching across pt's chest X 1 week without raditaion. Also reports cough with some shortness of breath with nasal congestion X 2 weeks.    Allergies No Known Allergies  Level of Care/Admitting Diagnosis ED Disposition    ED Disposition Condition Comment   Admit  Hospital Area: [facility] [[id number]]  Level of Care: Telemetry Cardiac [103]  I expect the patient will be discharged within 24 hours: Yes  LOW acuity---Tx typically complete <24 hrs---ACUTE conditions typically can be evaluated <24 hours---LABS likely to return to acceptable levels <24 hours---IS near functional baseline---EXPECTED to return to current living arrangement---NOT newly hypoxic: Meets criteria for 5C-Observation unit  Diagnosis: Chest pain [454098]  Admitting Physician: Charlsie Quest [[id number]]  Attending Physician: Charlsie Quest [[id number]]  PT Class (Do Not Modify): Observation [104]  PT Acc Code (Do Not Modify): Observation [10022]       B Medical/Surgery History Past Medical History:  Diagnosis Date  . GERD (gastroesophageal reflux disease)   . Hypercholesteremia   . Hypertension   . 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 No pertinent surgical history.   A IV Location/Drains/Wounds Patient Lines/Drains/Airways Status   Active Line/Drains/Airways    None          Intake/Output Last 24 hours No intake or output data in the 24 hours ending 08/27/18 0017  Labs/Imaging Results for orders placed or performed during the hospital encounter of  08/26/18 (from the past 48 hour(s))  Basic metabolic panel     Status: Abnormal   Collection Time: 08/26/18  9:12 PM  Result Value Ref Range   Sodium 140 135 - 145 mmol/L   Potassium 4.1 3.5 - 5.1 mmol/L   Chloride 106 98 - 111 mmol/L   CO2 25 22 - 32 mmol/L   Glucose, Bld 105 (H) 70 - 99 mg/dL   BUN 7 (L) 8 - 23 mg/dL   Creatinine, Ser 0.86 0.61 - 1.24 mg/dL   Calcium 8.8 (L) 8.9 - 10.3 mg/dL   GFR calc non Af Amer >60 >60 mL/min   GFR calc Af Amer >60 >60 mL/min   Anion gap 9 5 - 15    Comment: Performed at [facility], [address], [place], [place] [address]  CBC     Status: Abnormal   Collection Time: 08/26/18  9:12 PM  Result Value Ref Range   WBC 6.6 4.0 - 10.5 K/uL   RBC 4.22 4.22 - 5.81 MIL/uL   Hemoglobin 12.0 (L) 13.0 - 17.0 g/dL   HCT 96.2 (L) 95.2 - 84.1 %   MCV 91.7 80.0 - 100.0 fL   MCH 28.4 26.0 - 34.0 pg   MCHC 31.0 30.0 - 36.0 g/dL   RDW 32.4 40.1 - 02.7 %   Platelets 315 150 - 400 K/uL   nRBC 0.0 0.0 - 0.2 %    Comment: Performed at [facility], [address], [place], [place] [address]  D-dimer, quantitative (not at [facility])     Status: None  Collection Time: 08/26/18  9:12 PM  Result Value Ref Range   D-Dimer, Quant 0.36 0.00 - 0.50 ug/mL-FEU    Comment: (NOTE) At the manufacturer cut-off of 0.50 ug/mL FEU, this assay has been documented to exclude PE with a sensitivity and negative predictive value of 97 to 99%.  At this time, this assay has not been approved by the FDA to exclude DVT/VTE. Results should be correlated with clinical presentation. Performed at [facility], [address], [place], [place] [address]   I-stat troponin, ED     Status: None   Collection Time: 08/26/18  9:13 PM  Result Value Ref Range   Troponin i, poc 0.00 0.00 - 0.08 ng/mL   Comment 3            Comment: Due to the release kinetics of cTnI, a negative result within the first hours of the onset of symptoms does not rule out myocardial infarction  with certainty. If myocardial infarction is still suspected, repeat the test at appropriate intervals.    Dg Chest 2 View  Result Date: 08/26/2018 CLINICAL DATA:  Chest pain. Cough for 1 week. EXAM: CHEST - 2 VIEW COMPARISON:  None. FINDINGS: The cardiomediastinal contours are normal. The lungs are clear. Pulmonary vasculature is normal. No consolidation, pleural effusion, or pneumothorax. No acute osseous abnormalities are seen. IMPRESSION: No acute chest findings. Electronically Signed   By: Narda Rutherford M.D.   On: 08/26/2018 21:37    Pending Labs Unresulted Labs (From admission, onward)    Start     Ordered   08/27/18 0500  CBC  Tomorrow morning,   R     08/27/18 0001   08/27/18 0500  Basic metabolic panel  Tomorrow morning,   R     08/27/18 0001   08/27/18 0500  Lipid panel  Tomorrow morning,   R     08/27/18 0016   08/27/18 0017  Hemoglobin A1c  Add-on,   R     08/27/18 0016   08/27/18 0000  HIV antibody (Routine Testing)  Add-on,   R     08/27/18 0001   08/27/18 0000  Troponin I - Now Then Q6H  Now then every 6 hours,   TIMED     08/27/18 0001          Vitals/Pain Today's Vitals   08/26/18 2103 08/26/18 2104 08/26/18 2106  BP: (!) 168/90    Pulse: 68    Resp: 16    Temp: 98.1 F (36.7 C)    TempSrc: Oral    SpO2: 100%    Weight:  88.5 kg   Height:  5\' 5"  (1.651 m)   PainSc: 4   4     Isolation Precautions No active isolations  Medications Medications  acetaminophen (TYLENOL) tablet 650 mg (has no administration in time range)  ondansetron (ZOFRAN) injection 4 mg (has no administration in time range)  enoxaparin (LOVENOX) injection 40 mg (has no administration in time range)  aspirin EC tablet 81 mg (has no administration in time range)  amLODipine (NORVASC) tablet 5 mg (has no administration in time range)  atorvastatin (LIPITOR) tablet 20 mg (has no administration in time range)  citalopram (CELEXA) tablet 20 mg (has no administration in time range)   losartan (COZAAR) tablet 100 mg (has no administration in time range)  pantoprazole (PROTONIX) EC tablet 20 mg (has no administration in time range)    Mobility walks Low fall risk   Focused Assessments Cardiac  Assessment Handoff:  Cardiac Rhythm: Normal sinus rhythm No results found for: CKTOTAL, CKMB, CKMBINDEX, TROPONINI Lab Results  Component Value Date   DDIMER 0.36 08/26/2018   Does the Patient currently have chest pain? No     R Recommendations: See Admitting Provider Note  Report given to:   Additional Notes: N/A

## 2018-08-27 NOTE — Plan of Care (Signed)
Problem: Education: Goal: Knowledge of General Education information will improve Description Including pain rating scale, medication(s)/side effects and non-pharmacologic comfort measures 08/27/2018 1441 by Sherian Maroon, RN Outcome: Adequate for Discharge 08/27/2018 1437 by Sherian Maroon, RN Outcome: Adequate for Discharge   Problem: Health Behavior/Discharge Planning: Goal: Ability to manage health-related needs will improve 08/27/2018 1441 by Sherian Maroon, RN Outcome: Adequate for Discharge 08/27/2018 1437 by Sherian Maroon, RN Outcome: Adequate for Discharge   Problem: Clinical Measurements: Goal: Ability to maintain clinical measurements within normal limits will improve 08/27/2018 1441 by Sherian Maroon, RN Outcome: Adequate for Discharge 08/27/2018 1437 by Sherian Maroon, RN Outcome: Adequate for Discharge Goal: Will remain free from infection 08/27/2018 1441 by Sherian Maroon, RN Outcome: Adequate for Discharge 08/27/2018 1437 by Sherian Maroon, RN Outcome: Adequate for Discharge Goal: Diagnostic test results will improve 08/27/2018 1441 by Sherian Maroon, RN Outcome: Adequate for Discharge 08/27/2018 1437 by Sherian Maroon, RN Outcome: Adequate for Discharge Goal: Respiratory complications will improve 08/27/2018 1441 by Sherian Maroon, RN Outcome: Adequate for Discharge 08/27/2018 1437 by Sherian Maroon, RN Outcome: Adequate for Discharge Goal: Cardiovascular complication will be avoided 08/27/2018 1441 by Sherian Maroon, RN Outcome: Adequate for Discharge 08/27/2018 1437 by Sherian Maroon, RN Outcome: Adequate for Discharge   Problem: Activity: Goal: Risk for activity intolerance will decrease 08/27/2018 1441 by Sherian Maroon, RN Outcome: Adequate for Discharge 08/27/2018 1437 by Sherian Maroon, RN Outcome: Adequate for Discharge   Problem: Nutrition: Goal: Adequate nutrition will be maintained 08/27/2018 1441 by Sherian Maroon, RN Outcome:  Adequate for Discharge 08/27/2018 1437 by Sherian Maroon, RN Outcome: Adequate for Discharge   Problem: Coping: Goal: Level of anxiety will decrease 08/27/2018 1441 by Sherian Maroon, RN Outcome: Adequate for Discharge 08/27/2018 1437 by Sherian Maroon, RN Outcome: Adequate for Discharge   Problem: Elimination: Goal: Will not experience complications related to bowel motility 08/27/2018 1441 by Sherian Maroon, RN Outcome: Adequate for Discharge 08/27/2018 1437 by Sherian Maroon, RN Outcome: Adequate for Discharge Goal: Will not experience complications related to urinary retention 08/27/2018 1441 by Sherian Maroon, RN Outcome: Adequate for Discharge 08/27/2018 1437 by Sherian Maroon, RN Outcome: Adequate for Discharge   Problem: Pain Managment: Goal: General experience of comfort will improve 08/27/2018 1441 by Sherian Maroon, RN Outcome: Adequate for Discharge 08/27/2018 1437 by Sherian Maroon, RN Outcome: Adequate for Discharge   Problem: Skin Integrity: Goal: Risk for impaired skin integrity will decrease 08/27/2018 1441 by Sherian Maroon, RN Outcome: Adequate for Discharge 08/27/2018 1437 by Sherian Maroon, RN Outcome: Adequate for Discharge   Problem: Education: Goal: Understanding of cardiac disease, CV risk reduction, and recovery process will improve 08/27/2018 1441 by Sherian Maroon, RN Outcome: Adequate for Discharge 08/27/2018 1437 by Sherian Maroon, RN Outcome: Adequate for Discharge Goal: Individualized Educational Video(s) 08/27/2018 1441 by Sherian Maroon, RN Outcome: Adequate for Discharge 08/27/2018 1437 by Sherian Maroon, RN Outcome: Adequate for Discharge   Problem: Activity: Goal: Ability to tolerate increased activity will improve 08/27/2018 1441 by Sherian Maroon, RN Outcome: Adequate for Discharge 08/27/2018 1437 by Sherian Maroon, RN Outcome: Adequate for Discharge   Problem: Cardiac: Goal: Ability to achieve and maintain adequate  cardiovascular perfusion will improve 08/27/2018 1441 by Sherian Maroon, RN Outcome: Adequate for Discharge 08/27/2018 1437 by Sherian Maroon, RN Outcome: Adequate for Discharge  Problem: Health Behavior/Discharge Planning: Goal: Ability to safely manage health-related needs after discharge will improve 08/27/2018 1441 by Sherian Maroon, RN Outcome: Adequate for Discharge 08/27/2018 1437 by Sherian Maroon, RN Outcome: Adequate for Discharge

## 2018-08-27 NOTE — Discharge Summary (Signed)
Physician Discharge Summary  Austin Gibbs WCB:[id number] DOB: February 18, 1956 DOA: 08/26/2018  PCP: Tally Joe, MD  Admit date: 08/26/2018 Discharge date: 08/27/2018  Admitted From: Home Disposition:  Home  Recommendations for Outpatient Follow-up:  1. Follow up with PCP in 1-2 weeks 2. Please obtain BMP/CBC in one week   Home Health:No  Equipment/Devices:None  Discharge Condition:Stable CODE STATUS:Full Diet recommendation: Heart Healthy   Brief/Interim Summary: 63 y.o. male past medical history significant of hypertension hyperlipidemia GERD anxiety and obesity who presents to the ED for evaluation of chest pain.  Has felt frequent chest pain when he is going up a flight of stairs or walking uphill radiation to the left shoulder.  Discharge Diagnoses:  Principal Problem:   Chest pain Active Problems:   Hypertension   Hypercholesteremia  Atypical chest pain: He was admitted to the hospital but cardiac biomarkers were cycled which remained negative. He had no events on telemetry. D-dimer was less than 0.3. On physical exam his pain was reproducible by palpation. A 2D echo was done showed no As or wall motion, preserved EF. He was continue Lipitor, no change to the  other medications. He will follow-up with cardiology as an outpatient and will probably get a stress test then.  Essential hypertension: No changes were made to his medication. Discharge Instructions  Discharge Instructions    Diet - low sodium heart healthy   Complete by:  As directed    Increase activity slowly   Complete by:  As directed      Allergies as of 08/27/2018   No Known Allergies     Medication List    TAKE these medications   amLODipine 5 MG tablet Commonly known as:  NORVASC Take 5 mg by mouth daily.   aspirin 81 MG EC tablet Take 1 tablet (81 mg total) by mouth daily.   atorvastatin 20 MG tablet Commonly known as:  LIPITOR Take 20 mg by mouth daily.   citalopram 20 MG  tablet Commonly known as:  CELEXA Take 20 mg by mouth daily.   losartan 100 MG tablet Commonly known as:  COZAAR Take 100 mg by mouth daily.   Phenylephrine-Chlorphen-DM 03-08-11.5 MG/5ML Liqd Take 5 mLs by mouth every 4 (four) hours as needed.       No Known Allergies  Consultations:  None   Procedures/Studies: Dg Chest 2 View  Result Date: 08/26/2018 CLINICAL DATA:  Chest pain. Cough for 1 week. EXAM: CHEST - 2 VIEW COMPARISON:  None. FINDINGS: The cardiomediastinal contours are normal. The lungs are clear. Pulmonary vasculature is normal. No consolidation, pleural effusion, or pneumothorax. No acute osseous abnormalities are seen. IMPRESSION: No acute chest findings. Electronically Signed   By: Narda Rutherford M.D.   On: 08/26/2018 21:37     Subjective: Relates no new complaints.  Discharge Exam: Vitals:   08/27/18 0121 08/27/18 0409  BP: (!) 148/86 133/87  Pulse: 66 60  Resp: 18 18  Temp: 98 F (36.7 C) 98.7 F (37.1 C)  SpO2: 96% 100%     General: Pt is alert, awake, not in acute distress Cardiovascular: RRR, S1/S2 +, no rubs, no gallops Respiratory: CTA bilaterally, no wheezing, no rhonchi Abdominal: Soft, NT, ND, bowel sounds + Extremities: no edema, no cyanosis    The results of significant diagnostics from this hospitalization (including imaging, microbiology, ancillary and laboratory) are listed below for reference.     Microbiology: No results found for this or any previous visit (from the past 240 hour(s)).  Labs: BNP (last 3 results) No results for input(s): BNP in the last 8760 hours. Basic Metabolic Panel: Recent Labs  Lab 08/26/18 2112  NA 140  K 4.1  CL 106  CO2 25  GLUCOSE 105*  BUN 7*  CREATININE 1.09  CALCIUM 8.8*   Liver Function Tests: No results for input(s): AST, ALT, ALKPHOS, BILITOT, PROT, ALBUMIN in the last 168 hours. No results for input(s): LIPASE, AMYLASE in the last 168 hours. No results for input(s):  AMMONIA in the last 168 hours. CBC: Recent Labs  Lab 08/26/18 2112  WBC 6.6  HGB 12.0*  HCT 38.7*  MCV 91.7  PLT 315   Cardiac Enzymes: Recent Labs  Lab 08/27/18 0226  TROPONINI <0.03   BNP: Invalid input(s): POCBNP CBG: No results for input(s): GLUCAP in the last 168 hours. D-Dimer Recent Labs    08/26/18 2112  DDIMER 0.36   Hgb A1c Recent Labs    08/26/18 2112  HGBA1C 5.7*   Lipid Profile Recent Labs    08/27/18 0226  CHOL 123  HDL 42  LDLCALC 62  TRIG 96  CHOLHDL 2.9   Thyroid function studies No results for input(s): TSH, T4TOTAL, T3FREE, THYROIDAB in the last 72 hours.  Invalid input(s): FREET3 Anemia work up No results for input(s): VITAMINB12, FOLATE, FERRITIN, TIBC, IRON, RETICCTPCT in the last 72 hours. Urinalysis No results found for: COLORURINE, APPEARANCEUR, LABSPEC, PHURINE, GLUCOSEU, HGBUR, BILIRUBINUR, KETONESUR, PROTEINUR, UROBILINOGEN, NITRITE, LEUKOCYTESUR Sepsis Labs Invalid input(s): PROCALCITONIN,  WBC,  LACTICIDVEN Microbiology No results found for this or any previous visit (from the past 240 hour(s)).   Time coordinating discharge: 40 minutes  SIGNED:   Marinda Elk, MD  [facility]

## 2018-08-27 NOTE — Plan of Care (Signed)
  Problem: Education: Goal: Knowledge of General Education information will improve Description Including pain rating scale, medication(s)/side effects and non-pharmacologic comfort measures Outcome: Adequate for Discharge   Problem: Health Behavior/Discharge Planning: Goal: Ability to manage health-related needs will improve Outcome: Adequate for Discharge   Problem: Clinical Measurements: Goal: Ability to maintain clinical measurements within normal limits will improve Outcome: Adequate for Discharge Goal: Will remain free from infection Outcome: Adequate for Discharge Goal: Diagnostic test results will improve Outcome: Adequate for Discharge Goal: Respiratory complications will improve Outcome: Adequate for Discharge Goal: Cardiovascular complication will be avoided Outcome: Adequate for Discharge   Problem: Clinical Measurements: Goal: Will remain free from infection Outcome: Adequate for Discharge   Problem: Clinical Measurements: Goal: Diagnostic test results will improve Outcome: Adequate for Discharge   Problem: Clinical Measurements: Goal: Respiratory complications will improve Outcome: Adequate for Discharge   Problem: Activity: Goal: Risk for activity intolerance will decrease Outcome: Adequate for Discharge   Problem: Nutrition: Goal: Adequate nutrition will be maintained Outcome: Adequate for Discharge   Problem: Coping: Goal: Level of anxiety will decrease Outcome: Adequate for Discharge   Problem: Elimination: Goal: Will not experience complications related to bowel motility Outcome: Adequate for Discharge Goal: Will not experience complications related to urinary retention Outcome: Adequate for Discharge   Problem: Pain Managment: Goal: General experience of comfort will improve Outcome: Adequate for Discharge   Problem: Safety: Goal: Ability to remain free from injury will improve Outcome: Adequate for Discharge   Problem: Skin  Integrity: Goal: Risk for impaired skin integrity will decrease Outcome: Adequate for Discharge   Problem: Education: Goal: Understanding of cardiac disease, CV risk reduction, and recovery process will improve Outcome: Adequate for Discharge Goal: Individualized Educational Video(s) Outcome: Adequate for Discharge   Problem: Activity: Goal: Ability to tolerate increased activity will improve Outcome: Adequate for Discharge   Problem: Cardiac: Goal: Ability to achieve and maintain adequate cardiovascular perfusion will improve Outcome: Adequate for Discharge   Problem: Health Behavior/Discharge Planning: Goal: Ability to safely manage health-related needs after discharge will improve Outcome: Adequate for Discharge

## 2018-08-27 NOTE — Plan of Care (Signed)
  Problem: Pain Managment: Goal: General experience of comfort will improve Outcome: Progressing   Problem: Safety: Goal: Ability to remain free from injury will improve Outcome: Progressing   Problem: Activity: Goal: Ability to tolerate increased activity will improve Outcome: Progressing   Problem: Cardiac: Goal: Ability to achieve and maintain adequate cardiovascular perfusion will improve Outcome: Progressing   

## 2018-09-22 ENCOUNTER — Telehealth: Payer: Self-pay | Admitting: Cardiovascular Disease

## 2018-09-22 ENCOUNTER — Encounter: Payer: Self-pay | Admitting: *Deleted

## 2018-09-22 ENCOUNTER — Telehealth: Payer: Self-pay | Admitting: *Deleted

## 2018-09-22 NOTE — Telephone Encounter (Signed)
Virtual Visit Pre-Appointment Phone Call  Steps For Call:  1. Confirm consent - "In the setting of the current Covid19 crisis, you are scheduled for a (phone or video) visit with your provider on (date) at (time).  Just as we do with many in-office visits, in order for you to participate in this visit, we must obtain consent.  If you'd like, I can send this to your mychart (if signed up) or email for you to review.  Otherwise, I can obtain your verbal consent now.  All virtual visits are billed to your insurance company just like a normal visit would be.  By agreeing to a virtual visit, we'd like you to understand that the technology does not allow for your provider to perform an examination, and thus may limit your provider's ability to fully assess your condition. If your provider identifies any concerns that need to be evaluated in person, we will make arrangements to do so.  Finally, though the technology is pretty good, we cannot assure that it will always work on either your or our end, and in the setting of a video visit, we may have to convert it to a phone-only visit.  In either situation, we cannot ensure that we have a secure connection.  Are you willing to proceed?" STAFF: Did the patient verbally acknowledge consent to telehealth visit? Document YES/NO here: YES  2. Confirm the BEST phone number to call the day of the visit by including in appointment notes  3. Give patient instructions for MyChart download to smartphone OR [email or web address] as below if video visit (depending on what platform provider is using)  4. Confirm that appointment type is correct in Epic appointment notes (VIDEO vs PHONE)  5. Advise patient to be prepared with their blood pressure, heart rate, weight, any heart rhythm information, their current medicines, and a piece of paper and pen handy for any instructions they may receive the day of their visit  6. Inform patient they will receive a phone call 15 minutes  prior to their appointment time (may be from unknown caller ID) so they should be prepared to answer    TELEPHONE CALL NOTE  Austin Gibbs has been deemed a candidate for a follow-up tele-health visit to limit community exposure during the Covid-19 pandemic. I spoke with the patient via phone to ensure availability of phone/video source, confirm preferred email & phone number, and discuss instructions and expectations.  I reminded Austin Gibbs to be prepared with any vital sign and/or heart rhythm information that could potentially be obtained via home monitoring, at the time of his visit. I reminded Austin Gibbs to expect a phone call prior to his visit.  [name], CMA 09/22/2018 5:30 PM   INSTRUCTIONS FOR DOWNLOADING THE MYCHART APP TO SMARTPHONE  - The patient must first make sure to have activated MyChart and know their login information - If Apple, go to [employer] and type in MyChart in the search bar and download the app. If Android, ask patient to go to [employer] and type in [place] in the search bar and download the app. The app is free but as with any other app downloads, their phone may require them to verify saved payment information or Apple/Android password.  - The patient will need to then log into the app with their MyChart username and password, and select [facility] as their healthcare provider to link the account. When it is time for your  visit, go to the MyChart app, find appointments, and click Begin Video Visit. Be sure to Select Allow for your device to access the Microphone and Camera for your visit. You will then be connected, and your provider will be with you shortly.  **If they have any issues connecting, or need assistance please contact MyChart service desk [phone])**  **If using a computer, in order to ensure the best quality for their visit they will need to use either of the following Internet Browsers: [employer], or Google Chrome**  IF USING DOXIMITY or [email or web address].ME - The patient will receive a link just prior to their visit by text.     FULL LENGTH CONSENT FOR TELE-HEALTH VISIT   I hereby voluntarily request, consent and authorize [facility] and its employed or contracted physicians, physician assistants, nurse practitioners or other licensed health care professionals (the Practitioner), to provide me with telemedicine health care services (the "Services") as deemed necessary by the treating Practitioner. I acknowledge and consent to receive the Services by the Practitioner via telemedicine. I understand that the telemedicine visit will involve communicating with the Practitioner through live audiovisual communication technology and the disclosure of certain medical information by electronic transmission. I acknowledge that I have been given the opportunity to request an in-person assessment or other available alternative prior to the telemedicine visit and am voluntarily participating in the telemedicine visit.  I understand that I have the right to withhold or withdraw my consent to the use of telemedicine in the course of my care at any time, without affecting my right to future care or treatment, and that the Practitioner or I may terminate the telemedicine visit at any time. I understand that I have the right to inspect all information obtained and/or recorded in the course of the telemedicine visit and may receive copies of available information for a reasonable fee.  I understand that some of the potential risks of receiving the Services via telemedicine include:  Marland Kitchen Delay or interruption in medical evaluation due to technological equipment failure or disruption; . Information transmitted may not be sufficient (e.g. poor resolution of images) to allow for appropriate medical decision making by the Practitioner; and/or  . In rare instances, security protocols could fail, causing a breach of personal  health information.  Furthermore, I acknowledge that it is my responsibility to provide information about my medical history, conditions and care that is complete and accurate to the best of my ability. I acknowledge that Practitioner's advice, recommendations, and/or decision may be based on factors not within their control, such as incomplete or inaccurate data provided by me or distortions of diagnostic images or specimens that may result from electronic transmissions. I understand that the practice of medicine is not an exact science and that Practitioner makes no warranties or guarantees regarding treatment outcomes. I acknowledge that I will receive a copy of this consent concurrently upon execution via email to the email address I last provided but may also request a printed copy by calling the office of [facility].    I understand that my insurance will be billed for this visit.   I have read or had this consent read to me. . I understand the contents of this consent, which adequately explains the benefits and risks of the Services being provided via telemedicine.  . I have been provided ample opportunity to ask questions regarding this consent and the Services and have had my questions answered to my satisfaction. . I give my  informed consent for the services to be provided through the use of telemedicine in my medical care  By participating in this telemedicine visit I agree to the above.     Cardiac Questionnaire:    Since your last visit or hospitalization:    1. Have you been having new or worsening chest pain? NO   2. Have you been having new or worsening shortness of breath? NO 3. Have you been having new or worsening leg swelling, wt gain, or increase in abdominal girth (pants fitting more tightly)? NO   4. Have you had any passing out spells? NO    *A YES to any of these questions would result in the appointment being kept. *If all the answers to these questions are NO,  we should indicate that given the current situation regarding the worldwide coronarvirus pandemic, at the recommendation of the CDC, we are looking to limit gatherings in our waiting area, and thus will reschedule their appointment beyond four weeks from today.   _____________   COVID-19 Pre-Screening Questions:  . Do you currently have a fever? NO . Have you recently travelled on a cruise, internationally, or to [place], [place], [place], [place], [place], or [place] [employer] ? NO . Have you been in contact with someone that is currently pending confirmation of Covid19 testing or has been confirmed to have the Covid19 virus?  NO . Are you currently experiencing fatigue or cough? NO

## 2018-09-22 NOTE — Telephone Encounter (Signed)
Called x3 for pre reg °

## 2018-09-23 ENCOUNTER — Telehealth (INDEPENDENT_AMBULATORY_CARE_PROVIDER_SITE_OTHER): Payer: BLUE CROSS/BLUE SHIELD | Admitting: Cardiovascular Disease

## 2018-09-23 ENCOUNTER — Encounter: Payer: Self-pay | Admitting: Cardiovascular Disease

## 2018-09-23 VITALS — Ht 66.0 in | Wt 190.0 lb

## 2018-09-23 DIAGNOSIS — R079 Chest pain, unspecified: Secondary | ICD-10-CM

## 2018-09-23 NOTE — Progress Notes (Signed)
Virtual Visit via Video Note   This visit type was conducted due to national recommendations for restrictions regarding the COVID-19 Pandemic (e.g. social distancing) in an effort to limit this patient's exposure and mitigate transmission in our community.  Due to his co-morbid illnesses, this patient is at least at moderate risk for complications without adequate follow up.  This format is felt to be most appropriate for this patient at this time.  All issues noted in this document were discussed and addressed.  A limited physical exam was performed with this format.  Please refer to the patient's chart for his consent to telehealth for [facility].   Evaluation Performed:  Follow-up visit  Date:  09/23/2018   ID:  Austin Gibbs, DOB 10-11-1955, MRN [id number]  Patient Location: Home Provider Location: Office  PCP:  Tally JoeSwayne, David, MD  Cardiologist: Dr. Nanetta BattyJonathan Abrey Bradway Electrophysiologist:  None   Chief Complaint: Atypical chest pain, dyspnea on exertion  History of Present Illness:    Austin NeighboursVictor A Gibbs is a 63 y.o. moderately overweight married African-American male father of 6 children, grandfather of 4 grandchildren referred by Dr. Tally Joeavid Swayne , his PCP, for cardiovascular evaluation because of chest pain and shortness of breath.  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  His cardiac risk factors are notable for treated hypertension and hyperlipidemia.  There is no family history for heart disease.  He is never smoked.  He is never had a heart attack or stroke.  He was [facility] March [facility] for atypical chest pain.  He ruled out for myocardial infarction by enzymes.  His EKG showed no acute changes.  He had a 2D echo that was essentially normal as well.  He has had chest pain for a year occurring several times a week lasting 1 to 2 minutes at a time with left upper  extremity radiation although he has not had any chest pain in the last month.  The patient does not have symptoms concerning for COVID-19 infection (fever, chills, cough, or new shortness of breath).    Past Medical History:  Diagnosis Date  . GERD (gastroesophageal reflux disease)   . Hypercholesteremia   . Hypertension   . 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   Current Meds  Medication Sig  . amLODipine (NORVASC) 5 MG tablet Take 5 mg by mouth daily.  Marland Kitchen. aspirin EC 81 MG EC tablet Take 1 tablet (81 mg total) by mouth daily.  Marland Kitchen. atorvastatin (LIPITOR) 20 MG tablet Take 20 mg by mouth daily.  . citalopram (CELEXA) 20 MG tablet Take 20 mg by mouth daily.   Marland Kitchen. losartan (COZAAR) 100 MG tablet Take 100 mg by mouth daily.   Marland Kitchen. Phenylephrine-Chlorphen-DM 03-08-11.5 MG/5ML LIQD Take 5 mLs by mouth every 4 (four) hours as needed.     Allergies:   Patient has no known allergies.   Social History   Tobacco Use  . Smoking status: Never Smoker  . Smokeless tobacco: Never Used  Substance Use Topics  . Alcohol use: Yes    Comment: Occasional margarita on weekends.  . Drug use: No     Family Hx: The patient's family history includes COPD in his father; Heart attack in his maternal grandfather and paternal grandfather; Hyperlipidemia in his mother.  ROS:   Please  see the history of present illness.     All other systems reviewed and are negative.   Prior CV studies:   The following studies were reviewed today:  2D echocardiogram  Labs/Other Tests and Data Reviewed:    EKG:  An ECG dated 08/27/2018 was personally reviewed today and demonstrated:  Normal sinus rhythm at 64 without ST or T wave changes  Recent Labs: 08/27/2018: BUN 6; Creatinine, Ser 0.97; Hemoglobin 12.5; Platelets 327; Potassium 3.8; Sodium 141   Recent Lipid Panel Lab Results  Component Value Date/Time   CHOL 123 08/27/2018 02:26 AM   TRIG 96 08/27/2018  02:26 AM   HDL 42 08/27/2018 02:26 AM   CHOLHDL 2.9 08/27/2018 02:26 AM   LDLCALC 62 08/27/2018 02:26 AM    Wt Readings from Last 3 Encounters:  09/23/18 190 lb (86.2 kg)  08/27/18 189 lb 3.2 oz (85.8 kg)  03/31/15 184 lb (83.5 kg)     Objective:    Vital Signs:  Ht 5\' 6"  (1.676 m)   Wt 190 lb (86.2 kg)   BMI 30.67 kg/m    GEN:  no acute distress RESPIRATORY:  normal respiratory effort, symmetric expansion NEURO:  alert and oriented x 3, no obvious focal deficit PSYCH:  normal affect  ASSESSMENT & PLAN:    1. Essential hypertension- history of essential hypertension on amlodipine and losartan 2. Hyperlipidemia- history of hyperlipidemia on atorvastatin with lipid profile performed 08/27/2018 revealing a total cholesterol 123, LDL 62 and HDL of 42 3. Atypical chest pain- 1 year history of atypical chest pain occurring several times per week lasting 1 to 2 minutes at a time with left upper extremity radiation.  His risk factors include treated hypertension hyperlipidemia.  The pain is somewhat atypical.  I am going to get a routine GXT to further evaluate 4. Dyspnea on exertion- history of dyspnea on exertion with recent 2D echo performed during his hospitalization on 08/27/2018 that was entirely normal.  COVID-19 Education: The signs and symptoms of COVID-19 were discussed with the patient and how to seek care for testing (follow up with PCP or arrange E-visit).  The importance of social distancing was discussed today.  Time:   Today, I have spent 12 minutes with the patient with telehealth technology discussing the above problems.     Medication Adjustments/Labs and Tests Ordered: Current medicines are reviewed at length with the patient today.  Concerns regarding medicines are outlined above.   Tests Ordered: No orders of the defined types were placed in this encounter.   Medication Changes: No orders of the defined types were placed in this encounter.   Disposition:   Follow up as needed  Signed, Nanetta Batty, MD  09/23/2018 11:25 AM    [facility]

## 2018-09-23 NOTE — Patient Instructions (Signed)
Medication Instructions:  Your physician recommends that you continue on your current medications as directed. Please refer to the Current Medication list given to you today.  If you need a refill on your cardiac medications before your next appointment, please call your pharmacy.   Lab work: NONE If you have labs (blood work) drawn today and your tests are completely normal, you will receive your results only by: Marland Kitchen. MyChart Message (if you have MyChart) OR . A paper copy in the mail If you have any lab test that is abnormal or we need to change your treatment, we will call you to review the results.  Testing/Procedures: Your physician has requested that you have an exercise tolerance test. For further information please visit [email or web address]. Please also follow instruction sheet, as given.   Cardiopulmonary Exercise Stress Test Cardiopulmonary exercise testing (CPET) is a test that checks how your heart and lungs react to exercise. This is called your exercise capacity. During this test, you will walk or run on a treadmill or pedal on a stationary bike. As you walk or run, tests will be done on your heart and lungs. You may have this test to:  See why you are short of breath.  Check for exercise intolerance.  See how your lungs work.  See how your heart works.  Check for how you are responding to a heart or lung rehabilitation program.  See if you have a heart or lung problem.  See if you are healthy enough to have surgery. Tell your doctor about:  Any allergies you have.  All medicines you are taking.  Any problems you or family members have had with anesthetic medicines.  Any blood disorders you have.  Any surgeries you have had.  Any medical conditions you have.  Whether you are pregnant or may be pregnant. What are the risks? Generally, this is a safe test. However, problems may occur, including:  Chest pain.  Shortness of breath.  Leg pain.  Irregular  heartbeat. What happens before the procedure?  Follow instructions from your doctor about what you cannot eat or drink.  Ask your doctor about changing or stopping your normal medicines.  Wear loose, comfortable clothing and shoes.  If you use an inhaler, bring it with you to the test. What happens during the procedure?   A blood pressure cuff will be placed on your arm.  Stick-on patches (electrodes) will be placed on your chest. They will be attached to an EKG machine.  A clip-on monitor that shows the amount of oxygen in your blood will be placed on your finger (pulse oximeter).  A clip will be placed on your nose and a mouthpiece will be placed in your mouth. This may be held in place with a headpiece. You will breathe through the mouthpiece during the test.  You will be asked to start exercising. You will be closely watched while you exercise.  The amount of effort for your exercise will be gradually increased.  During exercise, the test will measure: ? Your heart rate. ? Your heart rhythm. ? Your oxygen blood level. ? The amount of oxygen and carbon dioxide that you breathe out.  The test will end when: ? You have finished the test. ? You have reached your maximum ability to exercise. ? You have chest or leg pain, dizziness, or shortness of breath. The procedure may vary among doctors and hospitals. What happens after the procedure?  Your blood pressure, heart rate, breathing rate, and blood  oxygen level will be monitored until you leave the hospital or clinic. Summary  Cardiopulmonary exercise testing (CPET) is a test that checks how your heart and lungs react to exercise.  Follow your doctor's instructions about food and drink, and what medicines to change or stop.  During this test, you will walk or run on a treadmill or pedal on a stationary bike. Tests will be done as you run or walk. This information is not intended to replace advice given to you by your  health care provider. Make sure you discuss any questions you have with your health care provider. Document Released: 05/09/2009 Document Revised: 01/23/2018 Document Reviewed: 01/23/2018 Elsevier Interactive Patient Education  2019 [employer].   Follow-Up: At [facility], you and your health needs are our priority.  As part of our continuing mission to provide you with exceptional heart care, we have created designated Provider Care Teams.  These Care Teams include your primary Cardiologist (physician) and Advanced Practice Providers (APPs -  Physician Assistants and Nurse Practitioners) who all work together to provide you with the care you need, when you need it. . You may schedule a follow up appointment AS NEEDED. You may see Dr. Allyson Sabal or one of the following Advanced Practice Providers on your designated Care Team:   . Corine Shelter, [place] . Azalee Course, PA-C . Micah Flesher, PA-C . Joni Reining, DNP . Theodore Demark, PA-C . Judy Pimple, PA-C . Marjie Skiff, PA-C

## 2018-11-04 ENCOUNTER — Other Ambulatory Visit: Payer: Self-pay

## 2018-11-04 ENCOUNTER — Emergency Department (HOSPITAL_COMMUNITY)
Admission: EM | Admit: 2018-11-04 | Discharge: 2018-11-05 | Disposition: A | Discharge status: Home / Self Care | Payer: BC Managed Care – PPO | Attending: Emergency Medicine | Admitting: Emergency Medicine

## 2018-11-04 DIAGNOSIS — I10 Essential (primary) hypertension: Secondary | ICD-10-CM | POA: Insufficient documentation

## 2018-11-04 DIAGNOSIS — Z79899 Other long term (current) drug therapy: Secondary | ICD-10-CM | POA: Insufficient documentation

## 2018-11-04 DIAGNOSIS — R112 Nausea with vomiting, unspecified: Secondary | ICD-10-CM | POA: Diagnosis not present

## 2018-11-04 DIAGNOSIS — Z7982 Long term (current) use of aspirin: Secondary | ICD-10-CM | POA: Insufficient documentation

## 2018-11-04 DIAGNOSIS — R55 Syncope and collapse: Secondary | ICD-10-CM | POA: Diagnosis not present

## 2018-11-04 LAB — CBC
HCT: 39.3 % (ref 39.0–52.0)
Hemoglobin: 12.6 g/dL — ABNORMAL LOW (ref 13.0–17.0)
MCH: 30 pg (ref 26.0–34.0)
MCHC: 32.1 g/dL (ref 30.0–36.0)
MCV: 93.6 fL (ref 80.0–100.0)
Platelets: 218 10*3/uL (ref 150–400)
RBC: 4.2 MIL/uL — ABNORMAL LOW (ref 4.22–5.81)
RDW: 14.4 % (ref 11.5–15.5)
WBC: 5.3 10*3/uL (ref 4.0–10.5)
nRBC: 0 % (ref 0.0–0.2)

## 2018-11-04 LAB — COMPREHENSIVE METABOLIC PANEL
ALT: 31 U/L (ref 0–44)
AST: 29 U/L (ref 15–41)
Albumin: 3.8 g/dL (ref 3.5–5.0)
Alkaline Phosphatase: 75 U/L (ref 38–126)
Anion gap: 6 (ref 5–15)
BUN: 15 mg/dL (ref 8–23)
CO2: 25 mmol/L (ref 22–32)
Calcium: 8.3 mg/dL — ABNORMAL LOW (ref 8.9–10.3)
Chloride: 109 mmol/L (ref 98–111)
Creatinine, Ser: 0.94 mg/dL (ref 0.61–1.24)
GFR calc Af Amer: >60 mL/min (ref >60)
GFR calc non Af Amer: >60 mL/min (ref >60)
Glucose, Bld: 104 mg/dL — ABNORMAL HIGH (ref 70–99)
Potassium: 4.1 mmol/L (ref 3.5–5.1)
Sodium: 140 mmol/L (ref 135–145)
Total Bilirubin: 0.7 mg/dL (ref 0.3–1.2)
Total Protein: 7 g/dL (ref 6.5–8.1)

## 2018-11-04 LAB — CBG MONITORING, ED: Glucose-Capillary: 86 mg/dL (ref 70–99)

## 2018-11-04 MED ORDER — SODIUM CHLORIDE 0.9% FLUSH: 3.0000 mL | Freq: Once | INTRAVENOUS | Status: DC

## 2018-11-04 NOTE — ED Triage Notes (Addendum)
Pt reports via ems from home with c/c of witnessed syncope with N/V. Denies CP, SOB, Diarrhea. Pt is a delivery driver for [employer] and reports he hasnt been taking in enough fluids lately. Initial BP was 88/68, is now 129/99. Medic placed an IV and gave 400cc of NS and 4mg  of zofran. Pt reports feeling better

## 2018-11-04 NOTE — ED Notes (Signed)
Bed: WG95 Expected date:  Expected time:  Means of arrival:  Comments: EMS 63 yo male syncopal episode/hypotensive-fluid bolus

## 2018-11-05 ENCOUNTER — Emergency Department (HOSPITAL_COMMUNITY): Payer: BC Managed Care – PPO

## 2018-11-05 LAB — URINALYSIS, ROUTINE W REFLEX MICROSCOPIC
Bilirubin Urine: NEGATIVE
Glucose, UA: NEGATIVE mg/dL
Hgb urine dipstick: NEGATIVE
Ketones, ur: NEGATIVE mg/dL
Leukocytes,Ua: NEGATIVE
Nitrite: NEGATIVE
Protein, ur: NEGATIVE mg/dL
Specific Gravity, Urine: 1.024 (ref 1.005–1.030)
pH: 5 (ref 5.0–8.0)

## 2018-11-05 MED ORDER — SODIUM CHLORIDE 0.9 % IV BOLUS
1000.0000 mL | Freq: Once | INTRAVENOUS | Status: AC
  Administered 2018-11-05: 1000 mL via INTRAVENOUS

## 2018-11-05 NOTE — ED Notes (Signed)
Pt given and verbalized understanding of d/c instructions and need for follow up with pcp. Told to return if s/s worsen. No further distress or question at time of ambulation out of department.

## 2018-11-05 NOTE — ED Provider Notes (Signed)
[facility]-EMERGENCY DEPT Provider Note   CSN: [id number] Arrival date & time: 11/04/18  2244    History   Chief Complaint Chief Complaint  Patient presents with  . Syncope  . Nausea    HPI Austin Gibbs is a 63 y.o. male with a hx of GERD, HTN presents to the Emergency Department complaining of acute syncope prior to arrival.  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  He has had nothing to eat or drink all day.  He arrived home and ate dinner.  After dinner he stood up to walk to the bedroom when he had a syncopal episode.  Patient denies prodrome but did wake up on the floor.  He reports 2 episodes of emesis afterwards.  No additional syncopal episodes.  Patient denies chest pain or shortness of breath.  He denies calf swelling or leg pain.  He denies a history of DVT.  He reports he has felt well throughout the day.  No known sick contacts.  No aggravating or alleviating factors.  Record review shows that patient was evaluated in March 2020 for chest pain.  At that time his work-up was negative including echocardiogram with normal EF and no wall motion abnormalities.  He had a follow-up telehealth visit with Dr. Gery Pray of heart care in April 2020 who scheduled him for an exercise tolerance test which has not yet been completed.  At the telehealth visit, Dr. Gery Pray did not have high suspicions for cardiac etiology of patient's intermittent chest pain.     The history is provided by the patient and medical records. No language interpreter was used.    Past Medical History:  Diagnosis Date  . GERD (gastroesophageal reflux disease)   . Hypercholesteremia   . Hypertension   . 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 Chest pain 08/26/2018  . Hypertension 08/26/2018  . Hypercholesteremia 08/26/2018    No past surgical history on file.      Home Medications    Prior  to Admission medications   Medication Sig Start Date End Date Taking? Authorizing Provider  amLODipine (NORVASC) 5 MG tablet Take 5 mg by mouth daily. 06/10/18  Yes [provider]  aspirin EC 81 MG EC tablet Take 1 tablet (81 mg total) by mouth daily. 08/27/18  Yes Marinda Elk, MD  atorvastatin (LIPITOR) 20 MG tablet Take 20 mg by mouth daily. 06/10/18  Yes [provider]  citalopram (CELEXA) 20 MG tablet Take 20 mg by mouth daily.    Yes [provider]  losartan (COZAAR) 100 MG tablet Take 100 mg by mouth daily.    Yes [provider]  Phenylephrine-Chlorphen-DM 03-08-11.5 MG/5ML LIQD Take 5 mLs by mouth every 4 (four) hours as needed. Patient not taking: Reported on 11/05/2018 12/17/12   Hayden Rasmussen, NP    Family History Family History  Problem Relation Age of Onset  . COPD Father   . Hyperlipidemia Mother   . Heart attack Maternal Grandfather        Died in 61s from heart attack  . Heart attack Paternal Grandfather        Died in 72s from heart attack    Social History Social History   Tobacco Use  . Smoking status: Never Smoker  . Smokeless tobacco: Never Used  Substance Use Topics  . Alcohol use: Yes  Comment: Occasional margarita on weekends.  . Drug use: No     Allergies   Patient has no known allergies.   Review of Systems Review of Systems  Constitutional: Negative for appetite change, diaphoresis, fatigue, fever and unexpected weight change.  HENT: Negative for mouth sores.   Eyes: Negative for visual disturbance.  Respiratory: Negative for cough, chest tightness, shortness of breath and wheezing.   Cardiovascular: Negative for chest pain.  Gastrointestinal: Positive for nausea and vomiting. Negative for abdominal pain, constipation and diarrhea.  Endocrine: Negative for polydipsia, polyphagia and polyuria.  Genitourinary: Negative for dysuria, frequency, hematuria and urgency.  Musculoskeletal: Negative for back pain  and neck stiffness.  Skin: Negative for rash.  Allergic/Immunologic: Negative for immunocompromised state.  Neurological: Positive for syncope. Negative for light-headedness and headaches.  Hematological: Does not bruise/bleed easily.  Psychiatric/Behavioral: Negative for sleep disturbance. The patient is not nervous/anxious.      Physical Exam Updated Vital Signs BP (!) 127/97 (BP Location: Right Arm)   Pulse 86   Temp 98.5 F (36.9 C) (Oral)   Resp 15   Ht  (1.651 m)   Wt 83.9 kg   SpO2 96%   BMI 30.79 kg/m   Physical Exam Vitals signs and nursing note reviewed.  Constitutional:      General: He is not in acute distress.    Appearance: He is not diaphoretic.  HENT:     Head: Normocephalic.  Eyes:     General: No scleral icterus.    Conjunctiva/sclera: Conjunctivae normal.  Neck:     Musculoskeletal: Normal range of motion.  Cardiovascular:     Rate and Rhythm: Normal rate and regular rhythm.     Pulses: Normal pulses.          Radial pulses are 2+ on the right side and 2+ on the left side.  Pulmonary:     Effort: No tachypnea, accessory muscle usage, prolonged expiration, respiratory distress or retractions.     Breath sounds: No stridor.     Comments: Equal chest rise. No increased work of breathing. Abdominal:     General: There is no distension.     Palpations: Abdomen is soft.     Tenderness: There is no abdominal tenderness. There is no guarding or rebound.  Musculoskeletal: Normal range of motion.     Right lower leg: No edema.     Left lower leg: No edema.     Comments: Moves all extremities equally and without difficulty. No calf tenderness  Skin:    General: Skin is warm and dry.     Capillary Refill: Capillary refill takes less than 2 seconds.  Neurological:     Mental Status: He is alert.     GCS: GCS eye subscore is 4. GCS verbal subscore is 5. GCS motor subscore is 6.     Comments: Speech is clear and goal oriented. No focal deficit on  gross neuro exam.  No facial droop or slurred speech. Ambulatory with steady gait  Psychiatric:        Mood and Affect: Mood normal.      ED Treatments / Results  Labs (all labs ordered are listed, but only abnormal results are displayed) Labs Reviewed  CBC - Abnormal; Notable for the following components:      Result Value   RBC 4.20 (*)    Hemoglobin 12.6 (*)    All other components within normal limits  COMPREHENSIVE METABOLIC PANEL - Abnormal; Notable for the following  components:   Glucose, Bld 104 (*)    Calcium 8.3 (*)    All other components within normal limits  URINALYSIS, ROUTINE W REFLEX MICROSCOPIC  CBG MONITORING, ED    EKG EKG Interpretation  Date/Time:  Tuesday November 04 2018 23:08:45 EDT Ventricular Rate:  70 PR Interval:    QRS Duration: 81 QT Interval:  399 QTC Calculation: 431 R Axis:   35 Text Interpretation:  Sinus rhythm Low voltage, precordial leads No significant change was found Confirmed by Paula LibraMolpus, John ([id number]) on 11/04/2018 11:51:20 PM   Radiology Dg Chest 2 View  Result Date: 11/05/2018 CLINICAL DATA:  63 year old male with syncope and low blood pressure EXAM: CHEST - 2 VIEW COMPARISON:  Chest radiograph dated 08/26/2018 FINDINGS: Mild eventration of the right hemidiaphragm. No focal consolidation, pleural effusion, or pneumothorax. The cardiac silhouette is within normal limits. No acute osseous pathology. IMPRESSION: No active cardiopulmonary disease. Electronically Signed   By: Elgie CollardArash  Radparvar M.D.   On: 11/05/2018 02:09    Procedures Procedures (including critical care time)  Medications Ordered in ED Medications  sodium chloride flush (NS) 0.9 % injection 3 mL (has no administration in time range)  sodium chloride 0.9 % bolus 1,000 mL (0 mLs Intravenous Stopped 11/05/18 0204)     Initial Impression / Assessment and Plan / ED Course  I have reviewed the triage vital signs and the nursing notes.  Pertinent labs & imaging results that  were available during my care of the patient were reviewed by me and considered in my medical decision making (see chart for details).  Clinical Course as of Nov 05 322  Wed Nov 05, 2018  0135 No tachycardia  Pulse Rate: 86 [HM]  0135 No hypotension  BP(!): 127/97 [HM]  0135 Sinus rhythm without ischemia.  EKG 12-Lead [HM]  0321 Pressure remained stable.  BP: 119/73 [HM]    Clinical Course User Index [HM] Meryl Ponder, Dahlia ClientHannah, PA-C        Patient presents after syncopal episode.  Suspect likely dehydration versus hypoglycemia as patient has had nothing to eat or drink for the day however patient had no prodrome prior to syncope. Cardiac vs orthostatic etiology for syncope is possible however patient has no chest pain or shortness of breath.  EKG is with normal sinus rhythm and patient has been monitored here 4 hours in the emergency department without arrhythmias.  At no point has he developed chest pain, shortness of breath, nausea or additional vomiting.  Orthostatic VS for the past 24 hrs:  BP- Lying Pulse- Lying BP- Sitting Pulse- Sitting BP- Standing at 0 minutes Pulse- Standing at 0 minutes  11/04/18 2351 125/74 70 133/73 70 130/77 76    Patient given IV fluids and reports feeling significantly better.  He has tolerated p.o. here without difficulty.  Discussed possibility of admission with patient however I do feel he can be treated in the outpatient setting.  He is established with cardiology and is pending an NST.  He will be able to have close follow-up with his primary care and cardiology within the next several days.  Strict return precautions given including any development of new symptoms or return of syncope.  Patient is to take today off of work and rest.  He is understanding and is in agreement with this plan.  The patient was discussed with and seen by Dr. Read DriversMolpus who agrees with the treatment plan.   Final Clinical Impressions(s) / ED Diagnoses   Final diagnoses:   Syncope,  unspecified syncope type    ED Discharge Orders    None       Jacion Dismore, Boyd Kerbs 11/05/18 0325    Molpus, Jonny Ruiz, MD 11/05/18 [phone]

## 2018-11-05 NOTE — Discharge Instructions (Addendum)
1. Medications: usual home medications 2. Treatment: rest, drink plenty of fluids,  3. Follow Up: Please followup with your primary doctor in 1-2 days and cardiology this week for discussion of your diagnoses and further evaluation after today's visit; Please return to the ER immediately if you have additional syncopal episodes.  Also return for chest pain, shortness of breath, abdominal pain, vomiting, high fevers or other concerns.

## 2018-11-05 NOTE — ED Notes (Signed)
Pt offered food and a drink however pt declined at the moment.

## 2018-12-02 NOTE — Telephone Encounter (Signed)
Open n error °

## 2018-12-10 ENCOUNTER — Telehealth: Payer: Self-pay

## 2018-12-10 ENCOUNTER — Other Ambulatory Visit: Payer: Self-pay

## 2018-12-10 NOTE — Telephone Encounter (Signed)
Spoke with pt and advised that he has appt for his GXT scheduled for 7/16 and needs COVID test to be able to do GXT. Pt set up for COVID test and advised to Go to: [facility]  [address] [place], [place] [address] FOR COVID-19 TEST on 12/15/2018 at 9:55am. Pt aware to self isolate until 7/16 GXT. Pt verbalized understanding

## 2018-12-15 ENCOUNTER — Other Ambulatory Visit (HOSPITAL_COMMUNITY)
Admission: RE | Admit: 2018-12-15 | Discharge: 2018-12-15 | Disposition: A | Discharge status: Home / Self Care | Payer: BC Managed Care – PPO | Source: Ambulatory Visit | Attending: Cardiovascular Disease | Admitting: Cardiovascular Disease

## 2018-12-15 DIAGNOSIS — Z1159 Encounter for screening for other viral diseases: Secondary | ICD-10-CM | POA: Insufficient documentation

## 2018-12-16 LAB — SARS CORONAVIRUS 2 (TAT 6-24 HRS): SARS Coronavirus 2: NEGATIVE

## 2018-12-17 ENCOUNTER — Telehealth (HOSPITAL_COMMUNITY): Payer: Self-pay

## 2018-12-17 NOTE — Telephone Encounter (Signed)
Encounter complete. 

## 2018-12-18 ENCOUNTER — Other Ambulatory Visit: Payer: Self-pay

## 2018-12-18 ENCOUNTER — Ambulatory Visit (HOSPITAL_COMMUNITY)
Admission: RE | Admit: 2018-12-18 | Discharge: 2018-12-18 | Disposition: A | Discharge status: Home / Self Care | Payer: BC Managed Care – PPO | Source: Ambulatory Visit | Attending: Cardiology | Admitting: Cardiology

## 2018-12-18 DIAGNOSIS — R079 Chest pain, unspecified: Secondary | ICD-10-CM | POA: Insufficient documentation

## 2018-12-18 LAB — EXERCISE TOLERANCE TEST
Estimated workload: 12.7 METS
Exercise duration (min): 10 min
Exercise duration (sec): 38 s
MPHR: 159 {beats}/min
Peak HR: 151 {beats}/min
Percent HR: 94 %
RPE: 17
Rest HR: 53 {beats}/min

## 2019-12-11 IMAGING — CR CHEST - 2 VIEW
2 series · 2 of 2 positions shown · non-contrast
Comparison: Chest radiograph dated 08/26/2018

CLINICAL DATA: 62-year-old male with syncope and low blood pressure

EXAM:
CHEST - 2 VIEW

[w chest lat]
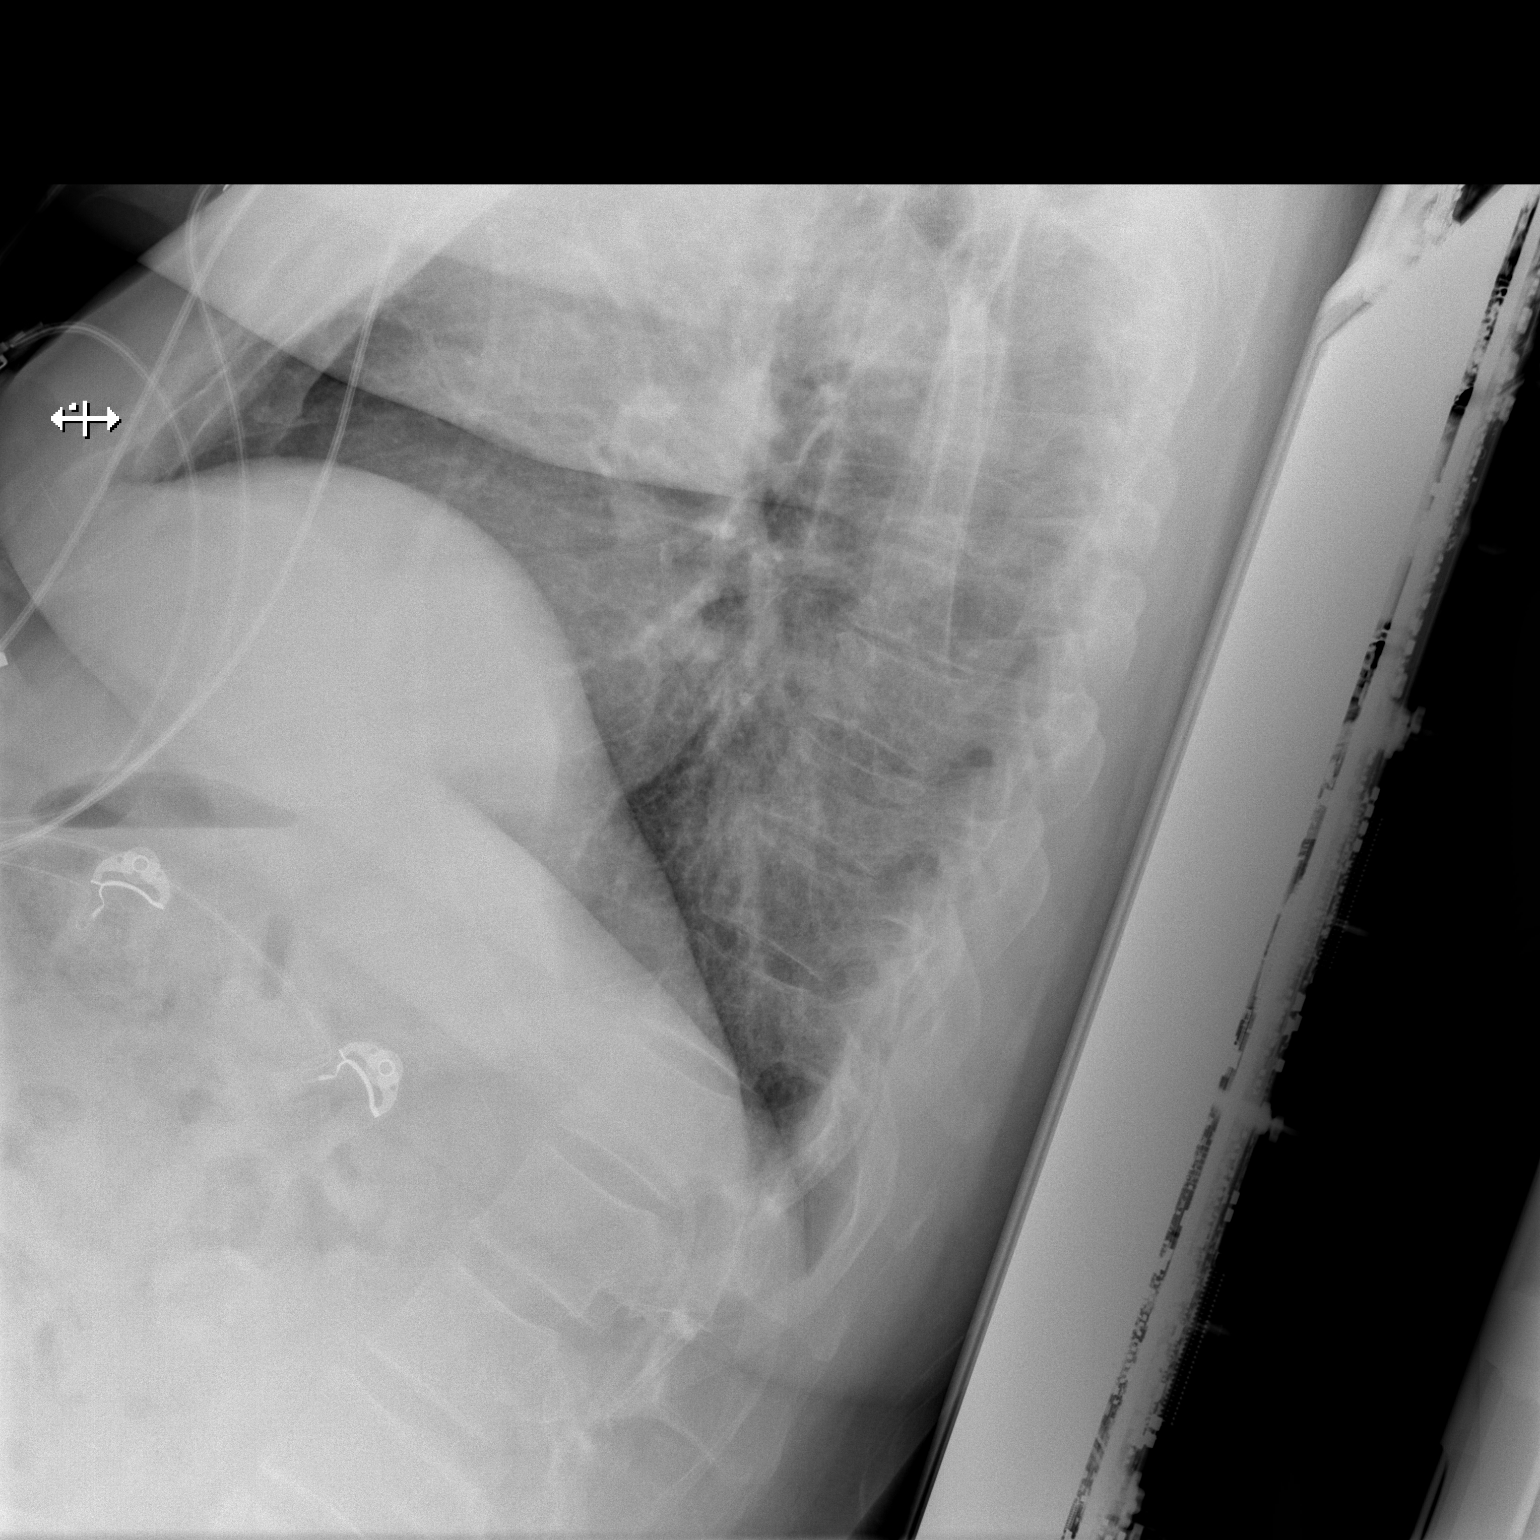

[x chest ap]
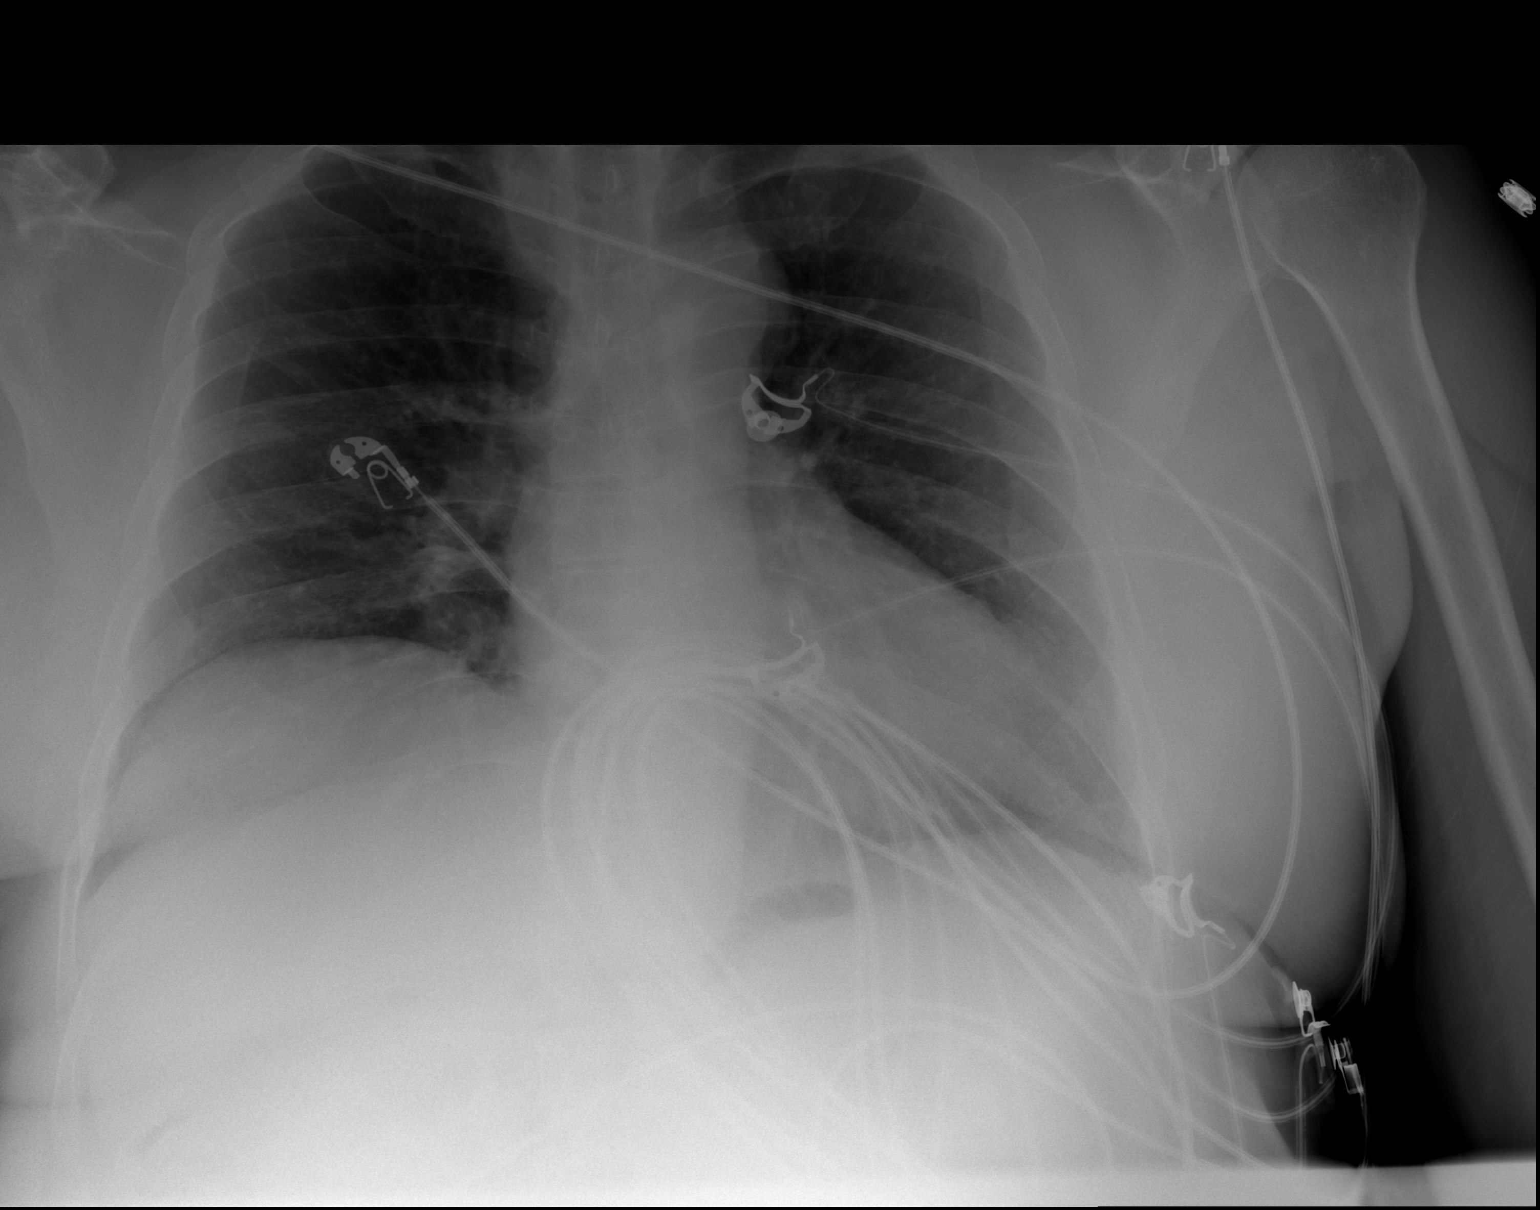

[2 of 2 positions shown; findings below may reference images not displayed]

FINDINGS: Mild eventration of the right hemidiaphragm. No focal consolidation,
pleural effusion, or pneumothorax. The cardiac silhouette is within
normal limits. No acute osseous pathology.
IMPRESSION: No active cardiopulmonary disease.

## 2020-07-05 ENCOUNTER — Other Ambulatory Visit: Payer: Self-pay

## 2020-07-05 ENCOUNTER — Encounter: Payer: Self-pay | Admitting: Family

## 2020-07-05 ENCOUNTER — Ambulatory Visit (INDEPENDENT_AMBULATORY_CARE_PROVIDER_SITE_OTHER): Payer: 59 | Admitting: Family

## 2020-07-05 VITALS — BP 130/70 | HR 57 | Temp 97.1°F | Resp 16 | Ht 65.0 in | Wt 182.8 lb

## 2020-07-05 DIAGNOSIS — F411 Generalized anxiety disorder: Secondary | ICD-10-CM

## 2020-07-05 DIAGNOSIS — K219 Gastro-esophageal reflux disease without esophagitis: Secondary | ICD-10-CM

## 2020-07-05 DIAGNOSIS — I10 Essential (primary) hypertension: Secondary | ICD-10-CM | POA: Diagnosis not present

## 2020-07-05 DIAGNOSIS — E6609 Other obesity due to excess calories: Secondary | ICD-10-CM

## 2020-07-05 DIAGNOSIS — Z1159 Encounter for screening for other viral diseases: Secondary | ICD-10-CM

## 2020-07-05 DIAGNOSIS — Z23 Encounter for immunization: Secondary | ICD-10-CM | POA: Diagnosis not present

## 2020-07-05 DIAGNOSIS — R7303 Prediabetes: Secondary | ICD-10-CM

## 2020-07-05 DIAGNOSIS — G2581 Restless legs syndrome: Secondary | ICD-10-CM

## 2020-07-05 DIAGNOSIS — R21 Rash and other nonspecific skin eruption: Secondary | ICD-10-CM

## 2020-07-05 DIAGNOSIS — E785 Hyperlipidemia, unspecified: Secondary | ICD-10-CM | POA: Diagnosis not present

## 2020-07-05 DIAGNOSIS — Z6830 Body mass index (BMI) 30.0-30.9, adult: Secondary | ICD-10-CM

## 2020-07-05 DIAGNOSIS — Z1211 Encounter for screening for malignant neoplasm of colon: Secondary | ICD-10-CM

## 2020-07-05 MED ORDER — HYDROCORTISONE 0.5 % EX CREA: 1.0000 "application " | TOPICAL_CREAM | Freq: Two times a day (BID) | CUTANEOUS | 0 refills | Status: DC

## 2020-07-05 NOTE — Patient Instructions (Addendum)
Restless Legs Syndrome Restless legs syndrome is a condition that causes uncomfortable feelings or sensations in the legs, especially while sitting or lying down. The sensations usually cause an overwhelming urge to move the legs. The arms can also sometimes be affected. The condition can range from mild to severe. The symptoms often interfere with a person's ability to sleep. What are the causes? The cause of this condition is not known. What increases the risk? The following factors may make you more likely to develop this condition:  Being older than 50.  Pregnancy.  Being a woman. In general, the condition is more common in women than in men.  A family history of the condition.  Having iron deficiency.  Overuse of caffeine, nicotine, or alcohol.  Certain medical conditions, such as kidney disease, Parkinson's disease, or nerve damage.  Certain medicines, such as those for high blood pressure, nausea, colds, allergies, depression, and some heart conditions. What are the signs or symptoms? The main symptom of this condition is uncomfortable sensations in the legs, such as:  Pulling.  Tingling.  Prickling.  Throbbing.  Crawling.  Burning. Usually, the sensations:  Affect both sides of the body.  Are worse when you sit or lie down.  Are worse at night. These may wake you up or make it difficult to fall asleep.  Make you have a strong urge to move your legs.  Are temporarily relieved by moving your legs. The arms can also be affected, but this is rare. People who have this condition often have tiredness during the day because of their lack of sleep at night. How is this diagnosed? This condition may be diagnosed based on:  Your symptoms.  Blood tests. In some cases, you may be monitored in a sleep lab by a specialist (a sleep study). This can detect any disruptions in your sleep. How is this treated? This condition is treated by managing the symptoms. This  may include:  Lifestyle changes, such as exercising, using relaxation techniques, and avoiding caffeine, alcohol, or tobacco.  Medicines. Anti-seizure medicines may be tried first. Follow these instructions at home: General instructions  Take over-the-counter and prescription medicines only as told by your health care provider.  Use methods to help relieve the uncomfortable sensations, such as: ? Massaging your legs. ? Walking or stretching. ? Taking a cold or hot bath.  Keep all follow-up visits as told by your health care provider. This is important. Lifestyle  Practice good sleep habits. For example, go to bed and get up at the same time every day. Most adults should get 7-9 hours of sleep each night.  Exercise regularly. Try to get at least 30 minutes of exercise most days of the week.  Practice ways of relaxing, such as yoga or meditation.  Avoid caffeine and alcohol.  Do not use any products that contain nicotine or tobacco, such as cigarettes and e-cigarettes. If you need help quitting, ask your health care provider.      Contact a health care provider if:  Your symptoms get worse or they do not improve with treatment. Summary  Restless legs syndrome is a condition that causes uncomfortable feelings or sensations in the legs, especially while sitting or lying down.  The symptoms often interfere with a person's ability to sleep.  This condition is treated by managing the symptoms. You may need to make lifestyle changes or take medicines. This information is not intended to replace advice given to you by your health care provider.  Make sure you discuss any questions you have with your health care provider. Document Revised: 07/10/2019 Document Reviewed: 06/10/2017 Elsevier Patient Education  2021 Elsevier Inc. https://www.mata.com/.pdf">   DASH Eating Plan DASH stands for Dietary Approaches to Stop Hypertension. The DASH eating  plan is a healthy eating plan that has been shown to:  Reduce high blood pressure (hypertension).  Reduce your risk for type 2 diabetes, heart disease, and stroke.  Help with weight loss. What are tips for following this plan? Reading food labels  Check food labels for the amount of salt (sodium) per serving. Choose foods with less than 5 percent of the Daily Value of sodium. Generally, foods with less than 300 milligrams (mg) of sodium per serving fit into this eating plan.  To find whole grains, look for the word "whole" as the first word in the ingredient list. Shopping  Buy products labeled as "low-sodium" or "no salt added."  Buy fresh foods. Avoid canned foods and pre-made or frozen meals. Cooking  Avoid adding salt when cooking. Use salt-free seasonings or herbs instead of table salt or sea salt. Check with your health care provider or pharmacist before using salt substitutes.  Do not fry foods. Cook foods using healthy methods such as baking, boiling, grilling, roasting, and broiling instead.  Cook with heart-healthy oils, such as olive, canola, avocado, soybean, or sunflower oil. Meal planning  Eat a balanced diet that includes: ? 4 or more servings of fruits and 4 or more servings of vegetables each day. Try to fill one-half of your plate with fruits and vegetables. ? 6-8 servings of whole grains each day. ? Less than 6 oz (170 g) of lean meat, poultry, or fish each day. A 3-oz (85-g) serving of meat is about the same size as a deck of cards. One egg equals 1 oz (28 g). ? 2-3 servings of low-fat dairy each day. One serving is 1 cup (237 mL). ? 1 serving of nuts, seeds, or beans 5 times each week. ? 2-3 servings of heart-healthy fats. Healthy fats called omega-3 fatty acids are found in foods such as walnuts, flaxseeds, fortified milks, and eggs. These fats are also found in cold-water fish, such as sardines, salmon, and mackerel.  Limit how much you eat of: ? Canned or  prepackaged foods. ? Food that is high in trans fat, such as some fried foods. ? Food that is high in saturated fat, such as fatty meat. ? Desserts and other sweets, sugary drinks, and other foods with added sugar. ? Full-fat dairy products.  Do not salt foods before eating.  Do not eat more than 4 egg yolks a week.  Try to eat at least 2 vegetarian meals a week.  Eat more home-cooked food and less restaurant, buffet, and fast food.   Lifestyle  When eating at a restaurant, ask that your food be prepared with less salt or no salt, if possible.  If you drink alcohol: ? Limit how much you use to:  0-1 drink a day for women who are not pregnant.  0-2 drinks a day for men. ? Be aware of how much alcohol is in your drink. In the [place] one drink equals one 12 oz bottle of beer (355 mL), one 5 oz glass of wine (148 mL), or one 1 oz glass of hard liquor (44 mL). General information  Avoid eating more than 2,300 mg of salt a day. If you have hypertension, you may need to reduce your sodium intake to  1,500 mg a day.  Work with your health care provider to maintain a healthy body weight or to lose weight. Ask what an ideal weight is for you.  Get at least 30 minutes of exercise that causes your heart to beat faster (aerobic exercise) most days of the week. Activities may include walking, swimming, or biking.  Work with your health care provider or dietitian to adjust your eating plan to your individual calorie needs. What foods should I eat? Fruits All fresh, dried, or frozen fruit. Canned fruit in natural juice (without added sugar). Vegetables Fresh or frozen vegetables (raw, steamed, roasted, or grilled). Low-sodium or reduced-sodium tomato and vegetable juice. Low-sodium or reduced-sodium tomato sauce and tomato paste. Low-sodium or reduced-sodium canned vegetables. Grains Whole-grain or whole-wheat bread. Whole-grain or whole-wheat pasta. Brown rice. Orpah Cobb. Bulgur.  Whole-grain and low-sodium cereals. Pita bread. Low-fat, low-sodium crackers. Whole-wheat flour tortillas. Meats and other proteins Skinless chicken or [place]. Ground chicken or [place]. Pork with fat trimmed off. Fish and seafood. Egg whites. Dried beans, peas, or lentils. Unsalted nuts, nut butters, and seeds. Unsalted canned beans. Lean cuts of beef with fat trimmed off. Low-sodium, lean precooked or cured meat, such as sausages or meat loaves. Dairy Low-fat (1%) or fat-free (skim) milk. Reduced-fat, low-fat, or fat-free cheeses. Nonfat, low-sodium ricotta or cottage cheese. Low-fat or nonfat yogurt. Low-fat, low-sodium cheese. Fats and oils Soft margarine without trans fats. Vegetable oil. Reduced-fat, low-fat, or light mayonnaise and salad dressings (reduced-sodium). Canola, safflower, olive, avocado, soybean, and sunflower oils. Avocado. Seasonings and condiments Herbs. Spices. Seasoning mixes without salt. Other foods Unsalted popcorn and pretzels. Fat-free sweets. The items listed above may not be a complete list of foods and beverages you can eat. Contact a dietitian for more information. What foods should I avoid? Fruits Canned fruit in a light or heavy syrup. Fried fruit. Fruit in cream or butter sauce. Vegetables Creamed or fried vegetables. Vegetables in a cheese sauce. Regular canned vegetables (not low-sodium or reduced-sodium). Regular canned tomato sauce and paste (not low-sodium or reduced-sodium). Regular tomato and vegetable juice (not low-sodium or reduced-sodium). Rosita Fire. Olives. Grains Baked goods made with fat, such as croissants, muffins, or some breads. Dry pasta or rice meal packs. Meats and other proteins Fatty cuts of meat. Ribs. Fried meat. Tomasa Blase. Bologna, salami, and other precooked or cured meats, such as sausages or meat loaves. Fat from the back of a pig (fatback). Bratwurst. Salted nuts and seeds. Canned beans with added salt. Canned or smoked fish. Whole eggs  or egg yolks. Chicken or [place] with skin. Dairy Whole or 2% milk, cream, and half-and-half. Whole or full-fat cream cheese. Whole-fat or sweetened yogurt. Full-fat cheese. Nondairy creamers. Whipped toppings. Processed cheese and cheese spreads. Fats and oils Butter. Stick margarine. Lard. Shortening. Ghee. Bacon fat. Tropical oils, such as coconut, palm kernel, or palm oil. Seasonings and condiments Onion salt, garlic salt, seasoned salt, table salt, and sea salt. Worcestershire sauce. Tartar sauce. Barbecue sauce. Teriyaki sauce. Soy sauce, including reduced-sodium. Steak sauce. Canned and packaged gravies. Fish sauce. Oyster sauce. Cocktail sauce. Store-bought horseradish. Ketchup. Mustard. Meat flavorings and tenderizers. Bouillon cubes. Hot sauces. Pre-made or packaged marinades. Pre-made or packaged taco seasonings. Relishes. Regular salad dressings. Other foods Salted popcorn and pretzels. The items listed above may not be a complete list of foods and beverages you should avoid. Contact a dietitian for more information. Where to find more information  National Heart, Lung, and Blood Institute: PopSteam.is  American Heart Association: www.heart.org  Academy of Nutrition and Dietetics: www.eatright.org  National Kidney Foundation: www.kidney.org Summary  The DASH eating plan is a healthy eating plan that has been shown to reduce high blood pressure (hypertension). It may also reduce your risk for type 2 diabetes, heart disease, and stroke.  When on the DASH eating plan, aim to eat more fresh fruits and vegetables, whole grains, lean proteins, low-fat dairy, and heart-healthy fats.  With the DASH eating plan, you should limit salt (sodium) intake to 2,300 mg a day. If you have hypertension, you may need to reduce your sodium intake to 1,500 mg a day.  Work with your health care provider or dietitian to adjust your eating plan to your individual calorie needs. This information  is not intended to replace advice given to you by your health care provider. Make sure you discuss any questions you have with your health care provider. Document Revised: 04/24/2019 Document Reviewed: 04/24/2019 Elsevier Patient Education  2021 [employer].

## 2020-07-05 NOTE — Progress Notes (Signed)
Provider: Marlowe Sax FNP-C   Thersa Mohiuddin, Nelda Bucks, NP  Patient Care Team: Taletha Twiford, Nelda Bucks, NP as PCP - General (Family Medicine)  Extended Emergency Contact Information Primary Emergency Contact: Miner,Dana Address: [address]          [place] [address] [place] Phone: [phone] Relation: Spouse Secondary Emergency Contact: Erle Crocker Mobile Phone: [phone] Relation: Other  Code Status: Full Code  Goals of care: Advanced Directive information Advanced Directives 07/05/2020  Does Patient Have a Medical Advance Directive? No  Does patient want to make changes to medical advance directive? No - Patient declined  Would patient like information on creating a medical advance directive? -     Chief Complaint  Patient presents with  . Establish Care    New Patient.     HPI:  Pt is a 65 y.o. male seen today to establish care for medical management of chronic diseases.He complains of restless leg syndrome worst at night.Has to uncover right leg at night.     Hypertension - no home blood pressure readings for review.on losartan 100 mg tablet daily and amlodipine 5 mg tablet daily.He denies any headache,chest tightness,chest pain,palpitation or shortness of breath.   Hyperlipidemia - no latest LDL for review.On atorvastatin 20 mg tablet daily.states did not have any lab work done in the past one year.does not do any exercises.states has gain weight.dietary and lifestyle modification discussed at length today.  GERD - getting worst has required Protonix twice daily.symptoms worsen whenever he eats late at night when he lies down he has burning sensation along on mid-chest sternum area.He denies any blood in the stool or dark colored stool. Certain foods like pizza,fried foods,tomatoes,spicy foods and alcohol worsen.Has stopped drinking alcohol and tries to avoid aggravating foods.  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   Hep C - no high risk behaviors or transfusion.    Colonoscopy - denies any acute issues.does not include veggies in diet.  Influenza vaccine - reports no signs of Upper respiratory infection.Has not had his flu shot.   Insomnia - has tried melatonin but was ineffective.Takes CannaAid 15 mg Delta-8 10 mg CBN at bedtime.   Past Medical History:  Diagnosis Date  . GERD (gastroesophageal reflux disease)   . Hypercholesteremia   . Hypertension   . 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 Restless leg syndrome    History reviewed. No pertinent surgical history.  No Known Allergies  Allergies as of 07/05/2020   No Known Allergies     Medication List       Accurate as of July 05, 2020  8:43 PM. If you have any questions, ask your nurse or doctor.        amLODipine 5 MG tablet Commonly known as: NORVASC Take 5 mg by mouth daily.   atorvastatin 20 MG tablet Commonly known as: LIPITOR Take 20 mg by mouth daily.   citalopram 40 MG tablet Commonly known as: CELEXA Take 40 mg by mouth daily.   hydrocortisone cream 0.5 % Apply 1 application topically 2 (two) times daily. Started by: Sandrea Hughs, NP   losartan 100 MG tablet Commonly known as: COZAAR Take 100 mg by mouth daily.   OVER THE COUNTER MEDICATION Take 2 capsules by mouth at bedtime. CannaAid 42m Delta-8  151mCBN   pantoprazole 40 MG tablet Commonly known as:  PROTONIX Take 40 mg by mouth daily.   sildenafil 20 MG tablet Commonly known as: REVATIO Take 20 mg by mouth as needed.       Review of Systems  Constitutional: Negative for appetite change, chills, fatigue and fever.  HENT: Negative for congestion, hearing loss, postnasal drip, rhinorrhea, sinus pressure, sinus pain, sneezing and sore throat.        Some food and pills get stuck on the mid chest  Eyes: Negative for pain, discharge, redness, itching and  visual disturbance.  Respiratory: Negative for cough, chest tightness, shortness of breath and wheezing.   Cardiovascular: Negative for chest pain, palpitations and leg swelling.  Gastrointestinal: Negative for abdominal distention, abdominal pain, constipation, diarrhea, nausea and vomiting.  Endocrine: Negative for cold intolerance, heat intolerance, polydipsia, polyphagia and polyuria.  Genitourinary: Negative for decreased urine volume, difficulty urinating, dysuria, flank pain, frequency, hematuria and urgency.       Erectile dysfunction   Musculoskeletal: Negative for arthralgias, back pain, gait problem, joint swelling, myalgias and neck pain.  Skin: Negative for color change, pallor and rash.  Neurological: Negative for dizziness, speech difficulty, weakness, light-headedness and headaches.       Sometimes has numbness and tingling on left hands   Hematological: Does not bruise/bleed easily.  Psychiatric/Behavioral: Positive for sleep disturbance. Negative for agitation, behavioral problems, confusion, decreased concentration and hallucinations. The patient is nervous/anxious.        Reports restless leg syndrome     Immunization History  Administered Date(s) Administered  . Influenza Split 04/13/2008, 05/19/2009, 03/02/2010, 04/19/2011, 04/04/2015  . Influenza, Quadrivalent, Recombinant, Inj, Pf 05/19/2019  . Influenza,inj,Quad PF,6+ Mos 02/11/2014, 03/15/2016, 05/21/2017, 07/05/2020  . Influenza,inj,Quad PF,6-35 Mos 05/09/2018  . Influenza-Unspecified 02/25/2012, 03/31/2013  . PFIZER(Purple Top)SARS-COV-2 Vaccination 08/04/2019, 09/10/2019, 03/11/2020  . Tdap 12/26/2010  . Zoster 08/07/2011   Pertinent  Health Maintenance Due  Topic Date Due  . COLONOSCOPY (Pts 45-43yr Insurance coverage will need to be confirmed)  Never done  . INFLUENZA VACCINE  Completed   Fall Risk  07/05/2020  Falls in the past year? 0  Number falls in past yr: 0  Injury with Fall? 0   Functional  Status Survey:    Vitals:   07/05/20 1331  BP: 130/70  Pulse: (!) 57  Resp: 16  Temp: (!) 97.1 F (36.2 C)  SpO2: 98%  Weight: 182 lb 12.8 oz (82.9 kg)  Height: '5\' 5"'  (1.651 m)   Body mass index is 30.42 kg/m. Physical Exam Vitals reviewed.  Constitutional:      General: He is not in acute distress.    Appearance: He is obese. He is not ill-appearing.  HENT:     Head: Normocephalic.     Right Ear: Tympanic membrane, ear canal and external ear normal. There is no impacted cerumen.     Left Ear: Tympanic membrane, ear canal and external ear normal. There is no impacted cerumen.     Nose: Nose normal. No congestion or rhinorrhea.     Mouth/Throat:     Mouth: Mucous membranes are moist.     Pharynx: Oropharynx is clear. No oropharyngeal exudate or posterior oropharyngeal erythema.  Eyes:     General: No scleral icterus.       Right eye: No discharge.        Left eye: No discharge.     Extraocular Movements: Extraocular movements intact.     Conjunctiva/sclera: Conjunctivae normal.     Pupils: Pupils are equal, round, and reactive to  light.  Neck:     Vascular: No carotid bruit.  Cardiovascular:     Rate and Rhythm: Normal rate and regular rhythm.     Pulses: Normal pulses.     Heart sounds: Normal heart sounds. No murmur heard. No friction rub. No gallop.   Pulmonary:     Effort: Pulmonary effort is normal. No respiratory distress.     Breath sounds: Normal breath sounds. No wheezing, rhonchi or rales.  Chest:     Chest wall: No tenderness.  Abdominal:     General: Bowel sounds are normal. There is no distension.     Palpations: Abdomen is soft. There is no mass.     Tenderness: There is no abdominal tenderness. There is no right CVA tenderness, left CVA tenderness, guarding or rebound.  Musculoskeletal:        General: No swelling or tenderness. Normal range of motion.     Cervical back: Normal range of motion. No rigidity or tenderness.     Right lower leg: No  edema.     Left lower leg: No edema.  Lymphadenopathy:     Cervical: No cervical adenopathy.  Skin:    General: Skin is warm and dry.     Coloration: Skin is not pale.     Findings: Rash present. No bruising or erythema.     Comments: Erythematous rash on umbilical and right lower quadrant area.   Neurological:     Mental Status: He is alert and oriented to person, place, and time.     Cranial Nerves: No cranial nerve deficit.     Sensory: No sensory deficit.     Motor: No weakness.     Coordination: Coordination normal.     Gait: Gait normal.  Psychiatric:        Mood and Affect: Mood normal.        Behavior: Behavior normal.        Thought Content: Thought content normal.        Judgment: Judgment normal.     Labs reviewed: No results for input(s): NA, K, CL, CO2, GLUCOSE, BUN, CREATININE, CALCIUM, MG, PHOS in the last 8760 hours. No results for input(s): AST, ALT, ALKPHOS, BILITOT, PROT, ALBUMIN in the last 8760 hours. No results for input(s): WBC, NEUTROABS, HGB, HCT, MCV, PLT in the last 8760 hours. No results found for: TSH Lab Results  Component Value Date   HGBA1C 5.7 (H) 08/26/2018   Lab Results  Component Value Date   CHOL 123 08/27/2018   HDL 42 08/27/2018   LDLCALC 62 08/27/2018   TRIG 96 08/27/2018   CHOLHDL 2.9 08/27/2018    Significant Diagnostic Results in last 30 days:  No results found.  Assessment/Plan  1. Primary hypertension No home blood pressure readings for review.B/p at goal today. - continue on amlodipine 5 mg tablet daily and Losartan 100 mg tablet daily  Not on ASA will check H/H due to complains of GERD then will initiate ASA if no signs of GI bleed.   - CBC with Differential/Platelet - CMP with eGFR([facility]) - TSH  2. Hyperlipidemia LDL goal <100 No LDL for review  - dietary modification and exercise at least 3 times per week for 30 minutes. Dietary and exercise modification discussed at length during visit. - continue on  atorvastatin 20 mg tablet daily.  - Lipid panel  3. Gastroesophageal reflux disease without esophagitis Has required Protonix 40 mg tablet daily.No signs of GI bleed.will check H/H - Encourage to  avoid aggravating foods and spices - Advised to avoid eating late in the evening or heavy meals.   - CBC with Differential/Platelet  4. 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  - Advised to follow up with counseling service   5. Encounter for hepatitis C screening test for low risk patient No high risk behaviors or hx of blood transfusion.  - Hep C Antibody  6. Encounter for screening colonoscopy Asymptomatic. Will refer to Gastroenterology made aware specialist office will call for appointment.  - Ambulatory referral to Gastroenterology  7. Prediabetes Lab Results  Component Value Date   HGBA1C 5.7 (H) 08/26/2018   Dietary and life style modification discussed during visit. - Hemoglobin A1c  8. Rash and nonspecific skin eruption Erythematous rash with scratch marks noted on lower umbilicus and right lower quadrant areas.  - hydrocortisone cream 0.5 %; Apply 1 application topically 2 (two) times daily.  Dispense: 30 g; Refill: 0  9. Restless leg syndrome Worst at night has to remove right leg out of the blanket covers. Will increase his exercise daily then will notify provider if medication required.    10. Need for influenza vaccination Afebrile. - No symptoms of URI's  - Influenza vaccine administered by [facility] no reactions reported.  - Flu Vaccine QUAD 6+ mos PF IM (Fluarix Quad PF)  11.Insomnia  Melatonin ineffective  - continue on CannaAid   11. Body mass index (BMI) of 30.0-30.9 in adult BMI 30.42  Dietary modification and exercise at least three times per week for 30 minutes.   12. Class 1 obesity due to excess calories without serious comorbidity with body mass index (BMI) of 30.0 to 30.9 in adult Dietary and lifestyle  modification discussed at length with patient during visit. Will refer to Nutritionist if needed.   Family/ staff Communication: Reviewed plan of care with patient verbalized understanding   Labs/tests ordered:   - CBC with Differential/Platelet - CMP with eGFR([facility]) - TSH - Hemoglobin A1C - Lipid panel - Hep C Antibody  Next Appointment : 4 months for medical management of chronic issues  Time spent with patient 60 minutes >50% time spent counseling; reviewing medical record; tests; labs; and developing future plan of care   Sandrea Hughs, NP

## 2020-07-06 LAB — COMPLETE METABOLIC PANEL WITH GFR
AG Ratio: 1.2 (calc) (ref 1.0–2.5)
ALT: 21 U/L (ref 9–46)
AST: 20 U/L (ref 10–35)
Albumin: 3.8 g/dL (ref 3.6–5.1)
Alkaline phosphatase (APISO): 64 U/L (ref 35–144)
BUN: 12 mg/dL (ref 7–25)
CO2: 28 mmol/L (ref 20–32)
Calcium: 9.3 mg/dL (ref 8.6–10.3)
Chloride: 107 mmol/L (ref 98–110)
Creat: 1.01 mg/dL (ref 0.70–1.25)
GFR, Est African American: 91 mL/min/{1.73_m2} (ref >=60)
GFR, Est Non African American: 79 mL/min/{1.73_m2} (ref >=60)
Globulin: 3.2 g/dL (calc) (ref 1.9–3.7)
Glucose, Bld: 91 mg/dL (ref 65–99)
Potassium: 4.5 mmol/L (ref 3.5–5.3)
Sodium: 142 mmol/L (ref 135–146)
Total Bilirubin: 0.8 mg/dL (ref 0.2–1.2)
Total Protein: 7 g/dL (ref 6.1–8.1)

## 2020-07-06 LAB — LIPID PANEL
Cholesterol: 193 mg/dL (ref <200)
HDL: 58 mg/dL (ref >=40)
LDL Cholesterol (Calc): 116 mg/dL (calc) — ABNORMAL HIGH
Non-HDL Cholesterol (Calc): 135 mg/dL (calc) — ABNORMAL HIGH (ref <130)
Total CHOL/HDL Ratio: 3.3 (calc) (ref <5.0)
Triglycerides: 86 mg/dL (ref <150)

## 2020-07-06 LAB — CBC WITH DIFFERENTIAL/PLATELET
Absolute Monocytes: 332 cells/uL (ref 200–950)
Basophils Absolute: 21 cells/uL (ref 0–200)
Basophils Relative: 0.5 %
Eosinophils Absolute: 42 cells/uL (ref 15–500)
Eosinophils Relative: 1 %
HCT: 42.1 % (ref 38.5–50.0)
Hemoglobin: 14.1 g/dL (ref 13.2–17.1)
Lymphs Abs: 1823 cells/uL (ref 850–3900)
MCH: 29.9 pg (ref 27.0–33.0)
MCHC: 33.5 g/dL (ref 32.0–36.0)
MCV: 89.2 fL (ref 80.0–100.0)
MPV: 11.3 fL (ref 7.5–12.5)
Monocytes Relative: 7.9 %
Neutro Abs: 1982 cells/uL (ref 1500–7800)
Neutrophils Relative %: 47.2 %
Platelets: 262 10*3/uL (ref 140–400)
RBC: 4.72 10*6/uL (ref 4.20–5.80)
RDW: 13 % (ref 11.0–15.0)
Total Lymphocyte: 43.4 %
WBC: 4.2 10*3/uL (ref 3.8–10.8)

## 2020-07-06 LAB — HEMOGLOBIN A1C
Hgb A1c MFr Bld: 5.6 % of total Hgb (ref <5.7)
Mean Plasma Glucose: 114 mg/dL
eAG (mmol/L): 6.3 mmol/L

## 2020-07-06 LAB — TSH: TSH: 2.74 mIU/L (ref 0.40–4.50)

## 2020-07-06 LAB — HEPATITIS C ANTIBODY
Hepatitis C Ab: NONREACTIVE
SIGNAL TO CUT-OFF: 0.02 (ref <1.00)

## 2020-07-12 ENCOUNTER — Other Ambulatory Visit: Payer: Self-pay | Admitting: *Deleted

## 2020-07-12 MED ORDER — SILDENAFIL CITRATE 20 MG PO TABS: 20.0000 mg | ORAL_TABLET | ORAL | 1 refills | Status: DC | PRN

## 2020-07-12 NOTE — Telephone Encounter (Signed)
Received refill Request from [facility] [address]  Pended Rx and sent to [facility] for approval.

## 2020-09-01 ENCOUNTER — Other Ambulatory Visit: Payer: Self-pay | Admitting: Family

## 2020-09-01 DIAGNOSIS — F419 Anxiety disorder, unspecified: Secondary | ICD-10-CM

## 2020-09-29 ENCOUNTER — Other Ambulatory Visit: Payer: Self-pay | Admitting: Family

## 2020-09-29 DIAGNOSIS — K219 Gastro-esophageal reflux disease without esophagitis: Secondary | ICD-10-CM

## 2020-10-19 ENCOUNTER — Ambulatory Visit (INDEPENDENT_AMBULATORY_CARE_PROVIDER_SITE_OTHER): Payer: 59 | Admitting: Nurse Practitioner

## 2020-10-19 ENCOUNTER — Encounter: Payer: Self-pay | Admitting: Nurse Practitioner

## 2020-10-19 ENCOUNTER — Other Ambulatory Visit: Payer: Self-pay

## 2020-10-19 VITALS — BP 142/82 | HR 87 | Temp 97.3°F | Resp 16 | Ht 65.0 in | Wt 182.0 lb

## 2020-10-19 DIAGNOSIS — J309 Allergic rhinitis, unspecified: Secondary | ICD-10-CM | POA: Diagnosis not present

## 2020-10-19 NOTE — Patient Instructions (Addendum)
neti pot twice daily Plain nasal saline spray throughout the day as needed May use tylenol 325 mg 2 tablets every 6 hours as needed aches and pains or sore throat  Mucinex DM by mouth twice daily as needed for cough and congestion with full glass of water  Keep well hydrated  Avoid forcefully blowing nose To start zyrtec 10 mg by mouth daily   If needed flonase 1 spray into both nares daily for congestion.    Allergies, Adult An allergy is a condition in which the body's defense system (immune system) comes in contact with an allergen and reacts to it. An allergen is anything that causes an allergic reaction. Allergens cause the immune system to make proteins for fighting infections (antibodies). These antibodies cause cells to release chemicals called histamines that set off the symptoms of an allergic reaction. Allergies often affect the nasal passages (allergic rhinitis), eyes (allergic conjunctivitis), skin (atopic dermatitis), and stomach. Allergies can be mild, moderate, or severe. They cannot spread from person to person. Allergies can develop at any age and may be outgrown. What are the causes? This condition is caused by allergens. Common allergens include:  Outdoor allergens, such as pollen, car fumes, and mold.  Indoor allergens, such as dust, smoke, mold, and pet dander.  Other allergens, such as foods, medicines, scents, insect bites or stings, and other skin irritants. What increases the risk? You are more likely to develop this condition if you have:  Family members with allergies.  Family members who have any condition that may be caused by allergens, such as asthma. This may make you more likely to have other allergies. What are the signs or symptoms? Symptoms of this condition depend on the severity of the allergy. Mild to moderate symptoms  Runny nose, stuffy nose (nasal congestion), or sneezing.  Itchy mouth, ears, or throat.  A feeling of mucus dripping down  the back of your throat (postnasal drip).  Sore throat.  Itchy, red, watery, or puffy eyes.  Skin rash, or itchy, red, swollen areas of skin (hives).  Stomach cramps or bloating. Severe symptoms Severe allergies to food, medicine, or insect bites may cause anaphylaxis, which can be life-threatening. Symptoms include:  A red (flushed) face.  Wheezing or coughing.  Swollen lips, tongue, or mouth.  Tight or swollen throat.  Chest pain or tightness, or rapid heartbeat.  Trouble breathing or shortness of breath.  Pain in the abdomen, vomiting, or diarrhea.  Dizziness or fainting. How is this diagnosed? This condition is diagnosed based on your symptoms, your family and medical history, and a physical exam. You may also have tests, including:  Skin tests to see how your skin reacts to allergens that may be causing your symptoms. Tests include: ? Skin prick test. For this test, an allergen is introduced to your body through a small opening in the skin. ? Intradermal skin test. For this test, a small amount of allergen is injected under the first layer of your skin. ? Patch test. For this test, a small amount of allergen is placed on your skin. The area is covered and then checked after a few days.  Blood tests.  A challenge test. For this test, you will eat or breathe in a small amount of allergen to see if you have an allergic reaction. You may also be asked to:  Keep a food diary. This is a record of all the foods, drinks, and symptoms you have in a day.  Try an elimination  diet. To do this: ? Remove certain foods from your diet. ? Add those foods back one by one to find out if any foods cause an allergic reaction. How is this treated? Treatment for allergies depends on your symptoms. Treatment may include:  Cold, wet cloths (cold compresses) to soothe itching and swelling.  Eye drops or nasal sprays.  Nasal irrigation to help clear your mucus or keep the nasal passages  moist.  A humidifier to add moisture to the air.  Skin creams to treat rashes or itching.  Oral antihistamines or other medicines to block the reaction or to treat inflammation.  Diet changes to remove foods that cause allergies.  Being exposed again and again to tiny amounts of allergens to help you build a defense against it (tolerance). This is called immunotherapy. Examples include: ? Allergy shot. You receive an injection that contains an allergen. ? Sublingual immunotherapy. You take a small dose of allergen under your tongue.  Emergency injection for anaphylaxis. You give yourself a shot using a syringe (auto-injector) that contains the amount of medicine you need. Your health care provider will teach you how to give yourself an injection.      Follow these instructions at home: Medicines  Take or apply over-the-counter and prescription medicines only as told by your health care provider.  Always carry your auto-injector pen if you are at risk of anaphylaxis. Give yourself an injection as told by your health care provider.   Eating and drinking  Follow instructions from your health care provider about eating or drinking restrictions.  Drink enough fluid to keep your urine pale yellow. General instructions  Wear a medical alert bracelet or necklace to let others know that you have had anaphylaxis before.  Avoid known allergens whenever possible.  Keep all follow-up visits as told by your health care provider. This is important. Contact a health care provider if:  Your symptoms do not get better with treatment. Get help right away if:  You have symptoms of anaphylaxis. These include: ? Swollen mouth, tongue, or throat. ? Pain or tightness in your chest. ? Trouble breathing or shortness of breath. ? Dizziness or fainting. ? Severe abdominal pain, vomiting, or diarrhea. These symptoms may represent a serious problem that is an emergency. Do not wait to see if the  symptoms will go away. Get medical help right away. Call your local emergency services (911 in the [place] Do not drive yourself to the hospital. Summary  Take or apply over-the-counter and prescription medicines only as told by your health care provider.  Avoid known allergens when possible.  Always carry your auto-injector pen if you are at risk of anaphylaxis. Give yourself an injection as told by your health care provider.  Wear a medical alert bracelet or necklace to let others know that you have had anaphylaxis before.  Anaphylaxis is a life-threatening emergency. Get help right away. This information is not intended to replace advice given to you by your health care provider. Make sure you discuss any questions you have with your health care provider. Document Revised: 01/18/2020 Document Reviewed: 04/01/2019 Elsevier Patient Education  2021 [employer].

## 2020-10-19 NOTE — Progress Notes (Signed)
Careteam: Patient Care Team: Ngetich, Donalee Citrin, NP as PCP - General (Family Medicine)  PLACE OF SERVICE:  [facility]  Advanced Directive information Does Patient Have a Medical Advance Directive?: No, Would patient like information on creating a medical advance directive?: No - Patient declined  No Known Allergies  Chief Complaint  Patient presents with  . Acute Visit    Patient complains of coughing all night, runny nose, and watery eyes.      HPI: Patient is a 65 y.o. male for nasal congestion for 5-6 days.  2 COVID test- both were negative Feeling sluggish.  Cough- nonproductive watery eyes- clear drainage and itchy Clear runny nose. Blowing nose frequently.  No fever.  Chest congestion- nyquil mucinex DM 4 hour tablet Sore throat- using lozenges    Review of Systems:  Review of Systems  Constitutional: Positive for malaise/fatigue. Negative for chills and fever.  HENT: Positive for congestion and sore throat. Negative for ear pain, hearing loss, nosebleeds and sinus pain.   Respiratory: Positive for cough. Negative for sputum production, shortness of breath and wheezing.   Cardiovascular: Negative for chest pain, palpitations and leg swelling.  Neurological: Positive for headaches. Negative for dizziness.    Past Medical History:  Diagnosis Date  . Erectile dysfunction   . GERD (gastroesophageal reflux disease)   . Hypercholesteremia   . Hypertension   . Insomnia   . Neuromuscular disorder (HCC)    Restless leg syndrome   . 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 Restless leg syndrome    History reviewed. No pertinent surgical history. Social History:   reports that he has never smoked. He has never used smokeless tobacco. He reports current alcohol use. He reports that he does not use drugs.  Family History  Problem Relation Age of Onset  . COPD Father 34  . Hyperlipidemia Mother 68  . Heart attack Maternal Grandfather         Died in 74s from heart attack  . Heart attack Paternal Grandfather        Died in 33s from heart attack    Medications: Patient's Medications  New Prescriptions   No medications on file  Previous Medications   AMLODIPINE (NORVASC) 5 MG TABLET    Take 5 mg by mouth daily.   ATORVASTATIN (LIPITOR) 20 MG TABLET    Take 20 mg by mouth daily.   CITALOPRAM (CELEXA) 40 MG TABLET    TAKE 1 TABLET BY MOUTH EVERY DAY   HYDROCORTISONE CREAM 0.5 %    Apply 1 application topically 2 (two) times daily.   LOSARTAN (COZAAR) 100 MG TABLET    TAKE 1 TABLET BY MOUTH EVERY DAY   OVER THE COUNTER MEDICATION    Take 2 capsules by mouth at bedtime. CannaAid 15mg  Delta-8  10mg  CBN   PANTOPRAZOLE (PROTONIX) 40 MG TABLET    TAKE 1 TABLET BY MOUTH DAILY OR AS DIRECTED   SILDENAFIL (REVATIO) 20 MG TABLET    Take 1 tablet (20 mg total) by mouth as needed. One hour prior  Modified Medications   No medications on file  Discontinued Medications   No medications on file    Physical Exam:  Vitals:   10/19/20 1046  BP: (!) 142/82  Pulse: 87  Resp: 16  Temp: (!) 97.3 F (36.3 C)  SpO2: 96%  Weight: 182 lb (82.6 kg)  Height: 5\' 5"  (1.651 m)   Body mass index is 30.29  kg/m. Wt Readings from Last 3 Encounters:  10/19/20 182 lb (82.6 kg)  07/05/20 182 lb 12.8 oz (82.9 kg)  11/04/18 185 lb (83.9 kg)    Physical Exam Constitutional:      General: He is not in acute distress.    Appearance: He is well-developed. He is not diaphoretic.  HENT:     Head: Normocephalic and atraumatic.     Right Ear: Tympanic membrane, ear canal and external ear normal.     Left Ear: Tympanic membrane, ear canal and external ear normal.     Nose: Congestion present.     Mouth/Throat:     Mouth: Mucous membranes are moist.     Pharynx: No oropharyngeal exudate or posterior oropharyngeal erythema.  Eyes:     Conjunctiva/sclera: Conjunctivae normal.     Pupils: Pupils are equal, round, and reactive to light.   Cardiovascular:     Rate and Rhythm: Normal rate and regular rhythm.     Heart sounds: Normal heart sounds.  Pulmonary:     Effort: Pulmonary effort is normal.     Breath sounds: Normal breath sounds.  Abdominal:     General: Bowel sounds are normal.     Palpations: Abdomen is soft.  Musculoskeletal:        General: No tenderness.     Cervical back: Normal range of motion and neck supple.  Skin:    General: Skin is warm and dry.  Neurological:     Mental Status: He is alert and oriented to person, place, and time.     Labs reviewed: Basic Metabolic Panel: Recent Labs    07/05/20 1510  NA 142  K 4.5  CL 107  CO2 28  GLUCOSE 91  BUN 12  CREATININE 1.01  CALCIUM 9.3  TSH 2.74   Liver Function Tests: Recent Labs    07/05/20 1510  AST 20  ALT 21  BILITOT 0.8  PROT 7.0   No results for input(s): LIPASE, AMYLASE in the last 8760 hours. No results for input(s): AMMONIA in the last 8760 hours. CBC: Recent Labs    07/05/20 1510  WBC 4.2  NEUTROABS 1,982  HGB 14.1  HCT 42.1  MCV 89.2  PLT 262   Lipid Panel: Recent Labs    07/05/20 1510  CHOL 193  HDL 58  LDLCALC 116*  TRIG 86  CHOLHDL 3.3   TSH: Recent Labs    07/05/20 1510  TSH 2.74   A1C: Lab Results  Component Value Date   HGBA1C 5.6 07/05/2020     Assessment/Plan 1. Allergic rhinitis, unspecified seasonality, unspecified trigger neti pot twice daily Plain nasal saline spray throughout the day as needed May use tylenol 325 mg 2 tablets every 6 hours as needed aches and pains or sore throat Mucinex DM by mouth twice daily as needed for cough and congestion with full glass of water  Keep well hydrated Avoid forcefully blowing nose To start zyrtec 10 mg by mouth daily  If needed flonase 1 spray into both nares daily for congestion.    Follow up precautions discussed Janene Harvey. Biagio Borg  [facility] [phone]

## 2020-11-08 ENCOUNTER — Ambulatory Visit: Payer: 59 | Admitting: Family

## 2020-11-15 ENCOUNTER — Encounter: Payer: Self-pay | Admitting: Family

## 2020-11-15 ENCOUNTER — Ambulatory Visit (INDEPENDENT_AMBULATORY_CARE_PROVIDER_SITE_OTHER): Payer: 59 | Admitting: Family

## 2020-11-15 ENCOUNTER — Other Ambulatory Visit: Payer: Self-pay

## 2020-11-15 VITALS — BP 160/88 | HR 74 | Temp 97.3°F | Resp 18 | Ht 65.0 in | Wt 183.0 lb

## 2020-11-15 DIAGNOSIS — K219 Gastro-esophageal reflux disease without esophagitis: Secondary | ICD-10-CM | POA: Diagnosis not present

## 2020-11-15 DIAGNOSIS — F411 Generalized anxiety disorder: Secondary | ICD-10-CM

## 2020-11-15 DIAGNOSIS — E785 Hyperlipidemia, unspecified: Secondary | ICD-10-CM | POA: Diagnosis not present

## 2020-11-15 DIAGNOSIS — I10 Essential (primary) hypertension: Secondary | ICD-10-CM

## 2020-11-15 DIAGNOSIS — J309 Allergic rhinitis, unspecified: Secondary | ICD-10-CM

## 2020-11-15 NOTE — Progress Notes (Signed)
Provider: Marlowe Sax FNP-C   [place] Straus, Nelda Bucks, NP  Patient Care Team: Damar Petit, Nelda Bucks, NP as PCP - General (Family Medicine)  Extended Emergency Contact Information Primary Emergency Contact: Kanouse,Dana Address: [address]          [place] [address] [place] Litter of [place] Phone: [phone] Relation: Spouse Secondary Emergency Contact: Erle Crocker Mobile Phone: [phone] Relation: Other  Code Status:  Full Code  Goals of care: Advanced Directive information Advanced Directives 11/15/2020  Does Patient Have a Medical Advance Directive? No  Does patient want to make changes to medical advance directive? -  Would patient like information on creating a medical advance directive? No - Patient declined     Chief Complaint  Patient presents with   Medical Management of Chronic Issues    4 month follow up.   Health Maintenance    Discuss the need for colonoscopy.    Immunizations    Discuss the need for Shingrix vaccine, and 2nd Covid Booster.     HPI:  Pt is a 65 y.o. male seen today for 4 for medical management of chronic diseases.    Hypertension - did not take his medication this morning.No home B/p readings for review.denies any headache,dizziness,vision changes,fatigue,chest tightness,palpitation,chest pain or shortness of breath.     Hyperlipidemia - past LDL 116 on Lipitor 20 daily   GERD - states Protonix effective.   Shingrix vaccine - advised to get vaccine at his pharmacy   2nd COVID-19 booster states has had 3 boosters vaccine at her pharmacy.  Due for colonoscopy - states had colonoscopy done < 10 yrs at [facility].will need to sign release of records.   Past Medical History:  Diagnosis Date   Erectile dysfunction    GERD (gastroesophageal reflux disease)    Hypercholesteremia    Hypertension    Insomnia    Neuromuscular disorder (HCC)    Restless leg syndrome    Phobic anxiety disorder    fear of  driving over bridges   Restless leg syndrome    History reviewed. No pertinent surgical history.  No Known Allergies  Allergies as of 11/15/2020   No Known Allergies      Medication List        Accurate as of November 15, 2020 11:59 PM. If you have any questions, ask your nurse or doctor.          amLODipine 5 MG tablet Commonly known as: NORVASC Take 5 mg by mouth daily.   atorvastatin 20 MG tablet Commonly known as: LIPITOR Take 20 mg by mouth daily.   citalopram 40 MG tablet Commonly known as: CELEXA TAKE 1 TABLET BY MOUTH EVERY DAY   hydrocortisone cream 0.5 % Apply 1 application topically 2 (two) times daily.   losartan 100 MG tablet Commonly known as: COZAAR TAKE 1 TABLET BY MOUTH EVERY DAY   OVER THE COUNTER MEDICATION Take 2 capsules by mouth at bedtime. CannaAid $RemoveBeforeDEI'15mg'[id number]$  Delta-8  $Remove'10mg'UTyLZPr$  CBN   pantoprazole 40 MG tablet Commonly known as: PROTONIX TAKE 1 TABLET BY MOUTH DAILY OR AS DIRECTED   sildenafil 20 MG tablet Commonly known as: REVATIO Take 1 tablet (20 mg total) by mouth as needed. One hour prior        Review of Systems  Constitutional:  Negative for appetite change, chills, fatigue, fever and unexpected weight change.  HENT:  Negative for congestion, dental problem, ear discharge, ear pain, facial swelling, hearing loss, nosebleeds, postnasal drip, rhinorrhea, sinus  pressure, sinus pain, sneezing, sore throat, tinnitus and trouble swallowing.   Eyes:  Negative for pain, discharge, redness, itching and visual disturbance.  Respiratory:  Negative for cough, chest tightness, shortness of breath and wheezing.   Cardiovascular:  Negative for chest pain, palpitations and leg swelling.  Gastrointestinal:  Negative for abdominal distention, abdominal pain, blood in stool, constipation, diarrhea, nausea and vomiting.  Endocrine: Negative for cold intolerance, heat intolerance, polydipsia, polyphagia and polyuria.  Genitourinary:  Negative for difficulty  urinating, dysuria, flank pain, frequency and urgency.       ED  Musculoskeletal:  Negative for arthralgias, back pain, gait problem, joint swelling, myalgias, neck pain and neck stiffness.  Skin:  Negative for color change, pallor, rash and wound.  Neurological:  Negative for dizziness, syncope, speech difficulty, weakness, light-headedness, numbness and headaches.       Chronic Tingling and numbness on left hand   Hematological:  Does not bruise/bleed easily.  Psychiatric/Behavioral:  Negative for agitation, behavioral problems, confusion, hallucinations, self-injury, sleep disturbance and suicidal ideas. The patient is nervous/anxious.    Immunization History  Administered Date(s) Administered   Influenza Split 04/13/2008, 05/19/2009, 03/02/2010, 04/19/2011, 04/04/2015   Influenza, Quadrivalent, Recombinant, Inj, Pf 05/19/2019   Influenza,inj,Quad PF,6+ Mos 02/11/2014, 03/15/2016, 05/21/2017, 07/05/2020   Influenza,inj,Quad PF,6-35 Mos 05/09/2018   Influenza-Unspecified 02/25/2012, 03/31/2013   PFIZER(Purple Top)SARS-COV-2 Vaccination 08/04/2019, 09/10/2019, 01/03/2020, 03/11/2020   Tdap 12/26/2010   Zoster, Live 08/07/2011   Pertinent  Health Maintenance Due  Topic Date Due   COLONOSCOPY (Pts 45-36yrs Insurance coverage will need to be confirmed)  Never done   INFLUENZA VACCINE  01/02/2021   Fall Risk  11/15/2020 10/19/2020 07/05/2020  Falls in the past year? 0 0 0  Number falls in past yr: 0 0 0  Injury with Fall? 0 0 0   Functional Status Survey:    Vitals:   11/15/20 1004  BP: (!) 160/88  Pulse: 74  Resp: 18  Temp: (!) 97.3 F (36.3 C)  SpO2: 98%  Weight: 183 lb (83 kg)  Height: $Remove'5\' 5"'[id number]$  (1.651 m)   Body mass index is 30.45 kg/m. Physical Exam Vitals reviewed.  Constitutional:      General: He is not in acute distress.    Appearance: Normal appearance. He is obese. He is not ill-appearing or diaphoretic.  HENT:     Head: Normocephalic.     Right Ear: Tympanic  membrane, ear canal and external ear normal. There is no impacted cerumen.     Left Ear: Tympanic membrane, ear canal and external ear normal. There is no impacted cerumen.     Nose: Nose normal. No congestion or rhinorrhea.     Mouth/Throat:     Mouth: Mucous membranes are moist.     Pharynx: Oropharynx is clear. No oropharyngeal exudate or posterior oropharyngeal erythema.  Eyes:     General: No scleral icterus.       Right eye: No discharge.        Left eye: No discharge.     Extraocular Movements: Extraocular movements intact.     Conjunctiva/sclera: Conjunctivae normal.     Pupils: Pupils are equal, round, and reactive to light.  Neck:     Vascular: No carotid bruit.  Cardiovascular:     Rate and Rhythm: Normal rate and regular rhythm.     Pulses: Normal pulses.     Heart sounds: Normal heart sounds. No murmur heard.   No friction rub. No gallop.  Pulmonary:  Effort: Pulmonary effort is normal. No respiratory distress.     Breath sounds: Normal breath sounds. No wheezing, rhonchi or rales.  Chest:     Chest wall: No tenderness.  Abdominal:     General: Bowel sounds are normal. There is no distension.     Palpations: Abdomen is soft. There is no mass.     Tenderness: There is no abdominal tenderness. There is no right CVA tenderness, left CVA tenderness, guarding or rebound.  Musculoskeletal:        General: No swelling or tenderness. Normal range of motion.     Cervical back: Normal range of motion. No rigidity or tenderness.     Right lower leg: No edema.     Left lower leg: No edema.  Lymphadenopathy:     Cervical: No cervical adenopathy.  Skin:    General: Skin is warm and dry.     Coloration: Skin is not pale.     Findings: No bruising, erythema, lesion or rash.  Neurological:     Mental Status: He is alert and oriented to person, place, and time.     Cranial Nerves: No cranial nerve deficit.     Sensory: No sensory deficit.     Motor: No weakness.      Coordination: Coordination normal.     Gait: Gait normal.  Psychiatric:        Mood and Affect: Mood normal.        Speech: Speech normal.        Behavior: Behavior normal.        Thought Content: Thought content normal.        Judgment: Judgment normal.    Labs reviewed: Recent Labs    07/05/20 1510  NA 142  K 4.5  CL 107  CO2 28  GLUCOSE 91  BUN 12  CREATININE 1.01  CALCIUM 9.3   Recent Labs    07/05/20 1510  AST 20  ALT 21  BILITOT 0.8  PROT 7.0   Recent Labs    07/05/20 1510  WBC 4.2  NEUTROABS 1,982  HGB 14.1  HCT 42.1  MCV 89.2  PLT 262   Lab Results  Component Value Date   TSH 2.74 07/05/2020   Lab Results  Component Value Date   HGBA1C 5.6 07/05/2020   Lab Results  Component Value Date   CHOL 193 07/05/2020   HDL 58 07/05/2020   LDLCALC 116 (H) 07/05/2020   TRIG 86 07/05/2020   CHOLHDL 3.3 07/05/2020    Significant Diagnostic Results in last 30 days:  No results found.  Assessment/Plan  1. Primary hypertension B/p elevated this visit but has not taken his blood pressure medication prior to visit.He will take his medication after visit. - Advised to check Blood pressure at home and record on log provided and notify provider if B/p > 140/90  - will continue current medication then increase amlodipine if SBP > 140  - CBC with Differential/Platelet; Future - CMP with eGFR([facility]); Future - TSH; Future  2. Hyperlipidemia LDL goal <100 Latest LDL close to goal  -  Dietary modification and exercise at least 3 times per week for 30 minutes advised. - additional DASH Eating plan Education information provided on AVS  - Lipid panel; Future  3. Gastroesophageal reflux disease without esophagitis Symptoms stable on Protonix  .dndi - CBC with Differential/Platelet; Future  4. Generalized anxiety disorder Stable. Continue on citalopram   5. Allergic rhinitis, unspecified seasonality, unspecified trigger Seasonal. Continue OTC  medication     Family/ staff Communication: Reviewed plan of care with patient verbalized understanding.  Labs/tests ordered:  - CBC with Differential/Platelet - CMP with eGFR([facility]) - TSH - Lipid panel  Next Appointment : 6 months for medical management of chronic issues.Fasting Labs prior to visit.    Sandrea Hughs, NP

## 2020-11-15 NOTE — Patient Instructions (Signed)
- Increase exercise to 3 times per week for at least 30 minutes.   https://www.mata.com/.pdf">  DASH Eating Plan DASH stands for Dietary Approaches to Stop Hypertension. The DASH eating plan is a healthy eating plan that has been shown to: Reduce high blood pressure (hypertension). Reduce your risk for type 2 diabetes, heart disease, and stroke. Help with weight loss. What are tips for following this plan? Reading food labels Check food labels for the amount of salt (sodium) per serving. Choose foods with less than 5 percent of the Daily Value of sodium. Generally, foods with less than 300 milligrams (mg) of sodium per serving fit into this eating plan. To find whole grains, look for the word "whole" as the first word in the ingredient list. Shopping Buy products labeled as "low-sodium" or "no salt added." Buy fresh foods. Avoid canned foods and pre-made or frozen meals. Cooking Avoid adding salt when cooking. Use salt-free seasonings or herbs instead of table salt or sea salt. Check with your health care provider or pharmacist before using salt substitutes. Do not fry foods. Cook foods using healthy methods such as baking, boiling, grilling, roasting, and broiling instead. Cook with heart-healthy oils, such as olive, canola, avocado, soybean, or sunflower oil. Meal planning  Eat a balanced diet that includes: 4 or more servings of fruits and 4 or more servings of vegetables each day. Try to fill one-half of your plate with fruits and vegetables. 6-8 servings of whole grains each day. Less than 6 oz (170 g) of lean meat, poultry, or fish each day. A 3-oz (85-g) serving of meat is about the same size as a deck of cards. One egg equals 1 oz (28 g). 2-3 servings of low-fat dairy each day. One serving is 1 cup (237 mL). 1 serving of nuts, seeds, or beans 5 times each week. 2-3 servings of heart-healthy fats. Healthy fats called omega-3 fatty acids are  found in foods such as walnuts, flaxseeds, fortified milks, and eggs. These fats are also found in cold-water fish, such as sardines, salmon, and mackerel. Limit how much you eat of: Canned or prepackaged foods. Food that is high in trans fat, such as some fried foods. Food that is high in saturated fat, such as fatty meat. Desserts and other sweets, sugary drinks, and other foods with added sugar. Full-fat dairy products. Do not salt foods before eating. Do not eat more than 4 egg yolks a week. Try to eat at least 2 vegetarian meals a week. Eat more home-cooked food and less restaurant, buffet, and fast food.  Lifestyle When eating at a restaurant, ask that your food be prepared with less salt or no salt, if possible. If you drink alcohol: Limit how much you use to: 0-1 drink a day for women who are not pregnant. 0-2 drinks a day for men. Be aware of how much alcohol is in your drink. In the [place] one drink equals one 12 oz bottle of beer (355 mL), one 5 oz glass of wine (148 mL), or one 1 oz glass of hard liquor (44 mL). General information Avoid eating more than 2,300 mg of salt a day. If you have hypertension, you may need to reduce your sodium intake to 1,500 mg a day. Work with your health care provider to maintain a healthy body weight or to lose weight. Ask what an ideal weight is for you. Get at least 30 minutes of exercise that causes your heart to beat faster (aerobic exercise) most days of  the week. Activities may include walking, swimming, or biking. Work with your health care provider or dietitian to adjust your eating plan to your individual calorie needs. What foods should I eat? Fruits All fresh, dried, or frozen fruit. Canned fruit in natural juice (without addedsugar). Vegetables Fresh or frozen vegetables (raw, steamed, roasted, or grilled). Low-sodium or reduced-sodium tomato and vegetable juice. Low-sodium or reduced-sodium tomatosauce and tomato paste. Low-sodium  or reduced-sodium canned vegetables. Grains Whole-grain or whole-wheat bread. Whole-grain or whole-wheat pasta. Brown rice. Austin Gibbs. Bulgur. Whole-grain and low-sodium cereals. Pita bread.Low-fat, low-sodium crackers. Whole-wheat flour tortillas. Meats and other proteins Skinless chicken or [place]. Ground chicken or [place]. Pork with fat trimmed off. Fish and seafood. Egg whites. Dried beans, peas, or lentils. Unsalted nuts, nut butters, and seeds. Unsalted canned beans. Lean cuts of beef with fat trimmed off. Low-sodium, lean precooked or cured meat, such as sausages or meatloaves. Dairy Low-fat (1%) or fat-free (skim) milk. Reduced-fat, low-fat, or fat-free cheeses. Nonfat, low-sodium ricotta or cottage cheese. Low-fat or nonfatyogurt. Low-fat, low-sodium cheese. Fats and oils Soft margarine without trans fats. Vegetable oil. Reduced-fat, low-fat, or light mayonnaise and salad dressings (reduced-sodium). Canola, safflower, olive, avocado, soybean, andsunflower oils. Avocado. Seasonings and condiments Herbs. Spices. Seasoning mixes without salt. Other foods Unsalted popcorn and pretzels. Fat-free sweets. The items listed above may not be a complete list of foods and beverages you can eat. Contact a dietitian for more information. What foods should I avoid? Fruits Canned fruit in a light or heavy syrup. Fried fruit. Fruit in cream or buttersauce. Vegetables Creamed or fried vegetables. Vegetables in a cheese sauce. Regular canned vegetables (not low-sodium or reduced-sodium). Regular canned tomato sauce and paste (not low-sodium or reduced-sodium). Regular tomato and vegetable juice(not low-sodium or reduced-sodium). Austin Gibbs. Olives. Grains Baked goods made with fat, such as croissants, muffins, or some breads. Drypasta or rice meal packs. Meats and other proteins Fatty cuts of meat. Ribs. Fried meat. Austin Gibbs. Bologna, salami, and other precooked or cured meats, such as sausages or meat  loaves. Fat from the back of a pig (fatback). Bratwurst. Salted nuts and seeds. Canned beans with added salt. Canned orsmoked fish. Whole eggs or egg yolks. Chicken or [place] with skin. Dairy Whole or 2% milk, cream, and half-and-half. Whole or full-fat cream cheese. Whole-fat or sweetened yogurt. Full-fat cheese. Nondairy creamers. Whippedtoppings. Processed cheese and cheese spreads. Fats and oils Butter. Stick margarine. Lard. Shortening. Ghee. Bacon fat. Tropical oils, suchas coconut, palm kernel, or palm oil. Seasonings and condiments Onion salt, garlic salt, seasoned salt, table salt, and sea salt. Worcestershire sauce. Tartar sauce. Barbecue sauce. Teriyaki sauce. Soy sauce, including reduced-sodium. Steak sauce. Canned and packaged gravies. Fish sauce. Oyster sauce. Cocktail sauce. Store-bought horseradish. Ketchup. Mustard. Meat flavorings and tenderizers. Bouillon cubes. Hot sauces. Pre-made or packaged marinades. Pre-made or packaged taco seasonings. Relishes. Regular saladdressings. Other foods Salted popcorn and pretzels. The items listed above may not be a complete list of foods and beverages you should avoid. Contact a dietitian for more information. Where to find more information National Heart, Lung, and Blood Institute: PopSteam.is American Heart Association: www.heart.org Academy of Nutrition and Dietetics: www.eatright.org National Kidney Foundation: www.kidney.org Summary The DASH eating plan is a healthy eating plan that has been shown to reduce high blood pressure (hypertension). It may also reduce your risk for type 2 diabetes, heart disease, and stroke. When on the DASH eating plan, aim to eat more fresh fruits and vegetables, whole grains, lean proteins, low-fat dairy, and  heart-healthy fats. With the DASH eating plan, you should limit salt (sodium) intake to 2,300 mg a day. If you have hypertension, you may need to reduce your sodium intake to 1,500 mg a day. Work  with your health care provider or dietitian to adjust your eating plan to your individual calorie needs. This information is not intended to replace advice given to you by your health care provider. Make sure you discuss any questions you have with your healthcare provider. Document Revised: 04/24/2019 Document Reviewed: 04/24/2019 Elsevier Patient Education  2022 [employer].

## 2021-02-03 ENCOUNTER — Other Ambulatory Visit: Payer: Self-pay | Admitting: Family

## 2021-03-24 ENCOUNTER — Other Ambulatory Visit: Payer: Self-pay | Admitting: Family

## 2021-03-24 DIAGNOSIS — K219 Gastro-esophageal reflux disease without esophagitis: Secondary | ICD-10-CM

## 2021-03-27 ENCOUNTER — Other Ambulatory Visit: Payer: Self-pay | Admitting: Family

## 2021-05-15 ENCOUNTER — Other Ambulatory Visit: Payer: Self-pay

## 2021-05-15 ENCOUNTER — Other Ambulatory Visit: Payer: 59

## 2021-05-15 DIAGNOSIS — I10 Essential (primary) hypertension: Secondary | ICD-10-CM

## 2021-05-15 DIAGNOSIS — E785 Hyperlipidemia, unspecified: Secondary | ICD-10-CM

## 2021-05-15 DIAGNOSIS — K219 Gastro-esophageal reflux disease without esophagitis: Secondary | ICD-10-CM

## 2021-05-15 LAB — COMPLETE METABOLIC PANEL WITH GFR
AG Ratio: 1.4 (calc) (ref 1.0–2.5)
ALT: 12 U/L (ref 9–46)
AST: 15 U/L (ref 10–35)
Albumin: 3.9 g/dL (ref 3.6–5.1)
Alkaline phosphatase (APISO): 66 U/L (ref 35–144)
BUN: 11 mg/dL (ref 7–25)
CO2: 25 mmol/L (ref 20–32)
Calcium: 8.7 mg/dL (ref 8.6–10.3)
Chloride: 107 mmol/L (ref 98–110)
Creat: 0.95 mg/dL (ref 0.70–1.35)
Globulin: 2.8 g/dL (calc) (ref 1.9–3.7)
Glucose, Bld: 92 mg/dL (ref 65–99)
Potassium: 3.9 mmol/L (ref 3.5–5.3)
Sodium: 140 mmol/L (ref 135–146)
Total Bilirubin: 0.8 mg/dL (ref 0.2–1.2)
Total Protein: 6.7 g/dL (ref 6.1–8.1)
eGFR: 89 mL/min/{1.73_m2} (ref >=60)

## 2021-05-15 LAB — CBC WITH DIFFERENTIAL/PLATELET
Absolute Monocytes: 303 cells/uL (ref 200–950)
Basophils Absolute: 21 cells/uL (ref 0–200)
Basophils Relative: 0.5 %
Eosinophils Absolute: 49 cells/uL (ref 15–500)
Eosinophils Relative: 1.2 %
HCT: 41.1 % (ref 38.5–50.0)
Hemoglobin: 13.4 g/dL (ref 13.2–17.1)
Lymphs Abs: 1509 cells/uL (ref 850–3900)
MCH: 29.5 pg (ref 27.0–33.0)
MCHC: 32.6 g/dL (ref 32.0–36.0)
MCV: 90.5 fL (ref 80.0–100.0)
MPV: 11.3 fL (ref 7.5–12.5)
Monocytes Relative: 7.4 %
Neutro Abs: 2218 cells/uL (ref 1500–7800)
Neutrophils Relative %: 54.1 %
Platelets: 245 10*3/uL (ref 140–400)
RBC: 4.54 10*6/uL (ref 4.20–5.80)
RDW: 13.1 % (ref 11.0–15.0)
Total Lymphocyte: 36.8 %
WBC: 4.1 10*3/uL (ref 3.8–10.8)

## 2021-05-15 LAB — LIPID PANEL
Cholesterol: 193 mg/dL (ref <200)
HDL: 51 mg/dL (ref >=40)
LDL Cholesterol (Calc): 124 mg/dL (calc) — ABNORMAL HIGH
Non-HDL Cholesterol (Calc): 142 mg/dL (calc) — ABNORMAL HIGH (ref <130)
Total CHOL/HDL Ratio: 3.8 (calc) (ref <5.0)
Triglycerides: 83 mg/dL (ref <150)

## 2021-05-15 LAB — TSH: TSH: 4.3 mIU/L (ref 0.40–4.50)

## 2021-05-16 ENCOUNTER — Ambulatory Visit (INDEPENDENT_AMBULATORY_CARE_PROVIDER_SITE_OTHER): Payer: 59 | Admitting: Family

## 2021-05-16 ENCOUNTER — Encounter: Payer: Self-pay | Admitting: Family

## 2021-05-16 VITALS — BP 120/86 | HR 69 | Temp 98.2°F | Resp 16 | Ht 65.0 in | Wt 186.0 lb

## 2021-05-16 DIAGNOSIS — Z23 Encounter for immunization: Secondary | ICD-10-CM

## 2021-05-16 DIAGNOSIS — G2581 Restless legs syndrome: Secondary | ICD-10-CM | POA: Insufficient documentation

## 2021-05-16 DIAGNOSIS — Z1211 Encounter for screening for malignant neoplasm of colon: Secondary | ICD-10-CM

## 2021-05-16 DIAGNOSIS — F411 Generalized anxiety disorder: Secondary | ICD-10-CM

## 2021-05-16 DIAGNOSIS — Z860100 Personal history of colon polyps, unspecified: Secondary | ICD-10-CM | POA: Insufficient documentation

## 2021-05-16 DIAGNOSIS — K219 Gastro-esophageal reflux disease without esophagitis: Secondary | ICD-10-CM | POA: Diagnosis not present

## 2021-05-16 DIAGNOSIS — E785 Hyperlipidemia, unspecified: Secondary | ICD-10-CM

## 2021-05-16 DIAGNOSIS — N529 Male erectile dysfunction, unspecified: Secondary | ICD-10-CM | POA: Insufficient documentation

## 2021-05-16 DIAGNOSIS — I10 Essential (primary) hypertension: Secondary | ICD-10-CM | POA: Diagnosis not present

## 2021-05-16 DIAGNOSIS — F419 Anxiety disorder, unspecified: Secondary | ICD-10-CM | POA: Insufficient documentation

## 2021-05-16 DIAGNOSIS — R7303 Prediabetes: Secondary | ICD-10-CM

## 2021-05-16 DIAGNOSIS — D72819 Decreased white blood cell count, unspecified: Secondary | ICD-10-CM | POA: Insufficient documentation

## 2021-05-16 DIAGNOSIS — R1319 Other dysphagia: Secondary | ICD-10-CM | POA: Insufficient documentation

## 2021-05-16 MED ORDER — TETANUS-DIPHTH-ACELL PERTUSSIS 5-2.5-18.5 LF-MCG/0.5 IM SUSP: 0.5000 mL | Freq: Once | INTRAMUSCULAR | 0 refills | Status: AC

## 2021-05-16 NOTE — Progress Notes (Signed)
Provider: Marlowe Sax FNP-C   Aamani Moose, Nelda Bucks, NP  Patient Care Team: Shiloh Southern, Nelda Bucks, NP as PCP - General (Family Medicine)  Extended Emergency Contact Information Primary Emergency Contact: Enriquez,Dana Address: [address]          [place] [address] John[place]te Litter [place] Phone: [phone] Relation: Spouse Secondary Emergency Contact: Erle Crocker Mobile Phone: [phone] Relation: Other  Code Status:  Full Code  Goals of care: Advanced Directive information Advanced Directives 05/16/2021  Does Patient Have a Medical Advance Directive? No  Does patient want to make changes to medical advance directive? -  Would patient like information on creating a medical advance directive? No - Patient declined     Chief Complaint  Patient presents with   Medical Management of Chronic Issues    6 month follow up/Discuss labs.   Health Maintenance    Discuss the need for colonoscopy.   Immunizations    Discuss the need for Shingrix vaccine, 3rd Covid Booster, Tetanus vaccine, and Influenza vaccine.    HPI:  Pt is a 65 y.o. male seen today for medical management of chronic diseases. Has medical history of hypertension,Prediabetes,Hyperlipidemia,GERD,Generalized Anxiety disorder,Insomnia,RLS among others. Recent lab results discussed unremarkable except  LDL 124 states has been eating lots of cheese in his salads.Not taking Lipitor on a daily basis states gives him acid reflux.usually takes in the morning.discussed taking with evening meals.   Denies any acute issues during visit.   States GI did not call for colonoscopy need another referral  Gets chest tightness every once a while with use of sildenafil but states won't stop taking it.  Has had no recent acute issues or hospitalization.    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 No pertinent surgical history.  No Known Allergies  Allergies as of 05/16/2021   No Known Allergies      Medication List        Accurate as of May 16, 2021 10:42 AM. If you have any questions, ask your nurse or doctor.          amLODipine 5 MG tablet Commonly known as: NORVASC Take 5 mg by mouth daily.   atorvastatin 20 MG tablet Commonly known as: LIPITOR Take 20 mg by mouth daily.   citalopram 40 MG tablet Commonly known as: CELEXA TAKE 1 TABLET BY MOUTH EVERY DAY   hydrocortisone cream 0.5 % Apply 1 application topically 2 (two) times daily.   losartan 100 MG tablet Commonly known as: COZAAR TAKE 1 TABLET BY MOUTH EVERY DAY   OVER THE COUNTER MEDICATION Take 2 capsules by mouth at bedtime. CannaAid 12m Delta-8  165mCBN   pantoprazole 40 MG tablet Commonly known as: PROTONIX TAKE 1 TABLET BY MOUTH DAILY OR AS DIRECTED   sildenafil 20 MG tablet Commonly known as: REVATIO TAKE ONE TABLET BY MOUTH DAILY AS NEEDED ONE HOUR PRIOR        Review of Systems  Constitutional:  Negative for appetite change, chills, fatigue, fever and unexpected weight change.  HENT:  Negative for congestion, dental problem, ear discharge, ear pain, facial swelling, hearing loss, nosebleeds, postnasal drip, rhinorrhea,  sinus pressure, sinus pain, sneezing, sore throat, tinnitus and trouble swallowing.   Eyes:  Negative for pain, discharge, redness, itching and visual disturbance.  Respiratory:  Negative for cough, chest tightness, shortness of breath and wheezing.   Cardiovascular:  Negative for chest pain, palpitations and leg swelling.  Gastrointestinal:  Negative for abdominal distention, abdominal pain, blood in stool, constipation, diarrhea, nausea and vomiting.  Endocrine: Negative for cold intolerance, heat  intolerance, polydipsia, polyphagia and polyuria.  Genitourinary:  Negative for difficulty urinating, dysuria, flank pain, frequency and urgency.  Musculoskeletal:  Negative for arthralgias, back pain, gait problem, joint swelling, myalgias, neck pain and neck stiffness.  Skin:  Negative for color change, pallor, rash and wound.  Neurological:  Negative for dizziness, syncope, speech difficulty, weakness, light-headedness and headaches.       Chronic numbness and tingling of left hand   Hematological:  Does not bruise/bleed easily.  Psychiatric/Behavioral:  Positive for sleep disturbance. Negative for agitation, behavioral problems, confusion, hallucinations, self-injury and suicidal ideas. The patient is nervous/anxious.    Immunization History  Administered Date(s) Administered   Influenza Split 04/13/2008, 05/19/2009, 03/02/2010, 04/19/2011, 04/04/2015   Influenza, Quadrivalent, Recombinant, Inj, Pf 05/19/2019   Influenza,inj,Quad PF,6+ Mos 02/11/2014, 03/15/2016, 05/21/2017, 07/05/2020, 05/16/2021   Influenza,inj,Quad PF,6-35 Mos 05/09/2018   Influenza-Unspecified 02/25/2012, 03/31/2013   PFIZER(Purple Top)SARS-COV-2 Vaccination 08/04/2019, 09/10/2019, 01/03/2020, 03/11/2020   Tdap 12/26/2010   Zoster, Live 08/07/2011   Pertinent  Health Maintenance Due  Topic Date Due   COLONOSCOPY (Pts 45-61yr Insurance coverage will need to be confirmed)  Never done   INFLUENZA VACCINE  Completed   Fall Risk 11/04/2018 07/05/2020 10/19/2020 11/15/2020 05/16/2021  Falls in the past year? - 0 0 0 0  Was there an injury with Fall? - 0 0 0 0  Fall Risk Category Calculator - 0 0 0 0  Fall Risk Category - Low Low Low Low  Patient Fall Risk Level Low fall risk Low fall risk Low fall risk Low fall risk Low fall risk  Patient at Risk for Falls Due to - - - - No Fall Risks  Fall risk Follow up - - - - Falls evaluation completed   Functional Status Survey:    Vitals:   05/16/21 1015  BP: 120/86  Pulse:  69  Resp: 16  Temp: 98.2 F (36.8 C)  SpO2: 98%  Weight: 186 lb (84.4 kg)  Height: '5\' 5"'  (1.651 m)   Body mass index is 30.95 kg/m. Physical Exam Vitals reviewed.  Constitutional:      General: He is not in acute distress.    Appearance: Normal appearance. He is obese. He is not ill-appearing or diaphoretic.  HENT:     Head: Normocephalic.     Right Ear: Tympanic membrane, ear canal and external ear normal. There is no impacted cerumen.     Left Ear: Tympanic membrane, ear canal and external ear normal. There is no impacted cerumen.     Nose: Nose normal. No congestion or rhinorrhea.     Mouth/Throat:     Mouth: Mucous membranes are moist.     Pharynx: Oropharynx is clear. No oropharyngeal exudate or posterior oropharyngeal erythema.  Eyes:     General: No scleral icterus.       Right eye: No discharge.        Left eye: No discharge.     Extraocular Movements: Extraocular movements intact.     Conjunctiva/sclera: Conjunctivae normal.     Pupils: Pupils are equal, round, and  reactive to light.  Neck:     Vascular: No carotid bruit.  Cardiovascular:     Rate and Rhythm: Normal rate and regular rhythm.     Pulses: Normal pulses.     Heart sounds: Normal heart sounds. No murmur heard.   No friction rub. No gallop.  Pulmonary:     Effort: Pulmonary effort is normal. No respiratory distress.     Breath sounds: Normal breath sounds. No wheezing, rhonchi or rales.  Chest:     Chest wall: No tenderness.  Abdominal:     General: Bowel sounds are normal. There is no distension.     Palpations: Abdomen is soft. There is no mass.     Tenderness: There is no abdominal tenderness. There is no right CVA tenderness, left CVA tenderness, guarding or rebound.  Musculoskeletal:        General: No swelling or tenderness. Normal range of motion.     Cervical back: Normal range of motion. No rigidity or tenderness.     Right lower leg: No edema.     Left lower leg: No edema.   Lymphadenopathy:     Cervical: No cervical adenopathy.  Skin:    General: Skin is warm and dry.     Coloration: Skin is not pale.     Findings: No bruising, erythema, lesion or rash.  Neurological:     Mental Status: He is alert and oriented to person, place, and time.     Cranial Nerves: No cranial nerve deficit.     Sensory: No sensory deficit.     Motor: No weakness.     Coordination: Coordination normal.     Gait: Gait normal.  Psychiatric:        Mood and Affect: Mood normal.        Speech: Speech normal.        Behavior: Behavior normal.        Thought Content: Thought content normal.        Judgment: Judgment normal.    Labs reviewed: Recent Labs    07/05/20 1510 05/15/21 0941  NA 142 140  K 4.5 3.9  CL 107 107  CO2 28 25  GLUCOSE 91 92  BUN 12 11  CREATININE 1.01 0.95  CALCIUM 9.3 8.7   Recent Labs    07/05/20 1510 05/15/21 0941  AST 20 15  ALT 21 12  BILITOT 0.8 0.8  PROT 7.0 6.7   Recent Labs    07/05/20 1510 05/15/21 0941  WBC 4.2 4.1  NEUTROABS 1,982 2,218  HGB 14.1 13.4  HCT 42.1 41.1  MCV 89.2 90.5  PLT 262 245   Lab Results  Component Value Date   TSH 4.30 05/15/2021   Lab Results  Component Value Date   HGBA1C 5.6 07/05/2020   Lab Results  Component Value Date   CHOL 193 05/15/2021   HDL 51 05/15/2021   LDLCALC 124 (H) 05/15/2021   TRIG 83 05/15/2021   CHOLHDL 3.8 05/15/2021    Significant Diagnostic Results in last 30 days:  No results found.  Assessment/Plan 1. Need for influenza vaccination Afebrile  Flu shot administered by CMA no acute reaction reported.  - Flu Vaccine QUAD 6+ mos PF IM (Fluarix Quad PF)  2. Primary hypertension B/p well controlled. Continue on amlodipine and losartan  - CBC with Differential/Platelet; Future - CMP with eGFR([facility]); Future - TSH; Future  3. Hyperlipidemia LDL goal <100 Continue on atorvastatin  - dietary modification and exercise  -  DASH eating plan provided on AVS  -  Lipid panel; Future  4. Gastroesophageal reflux disease without esophagitis H/H stable  Continue on Protonix   5. Encounter for screening colonoscopy Request another referral to GI states no GI call him from previous referral.  - Ambulatory referral to Gastroenterology  6. Prediabetes Lab Results  Component Value Date   HGBA1C 5.6 07/05/2020  Has improved  - Hemoglobin A1c; Future  7. Generalized anxiety disorder Stable. Continue on Celexa   8. Need for Tdap vaccination Advised to get Tdap vaccine at his pharmacy script send today.  - Tdap ([place]) 5-2.5-18.5 LF-MCG/0.5 injection; Inject 0.5 mLs into the muscle once for 1 dose.  Dispense: 0.5 mL; Refill: 0  Family/ staff Communication: Reviewed plan of care with patient verbalized understanding   Labs/tests ordered:  - CBC with Differential/Platelet - CMP with eGFR([facility]) - TSH - Hgb A1C - Lipid panel  Next Appointment : 6 months for medical management of chronic issues.Fasting Labs prior to visit.    Sandrea Hughs, NP

## 2021-05-16 NOTE — Patient Instructions (Signed)

## 2021-05-18 ENCOUNTER — Ambulatory Visit: Payer: 59 | Admitting: Family

## 2021-05-22 ENCOUNTER — Other Ambulatory Visit: Payer: Self-pay | Admitting: Family

## 2021-05-22 DIAGNOSIS — F419 Anxiety disorder, unspecified: Secondary | ICD-10-CM

## 2021-08-22 ENCOUNTER — Other Ambulatory Visit: Payer: Self-pay | Admitting: Family

## 2021-08-22 DIAGNOSIS — K219 Gastro-esophageal reflux disease without esophagitis: Secondary | ICD-10-CM

## 2021-09-06 ENCOUNTER — Other Ambulatory Visit: Payer: Self-pay | Admitting: Family

## 2021-11-21 ENCOUNTER — Ambulatory Visit (INDEPENDENT_AMBULATORY_CARE_PROVIDER_SITE_OTHER): Payer: 59 | Admitting: Family

## 2021-11-21 ENCOUNTER — Encounter: Payer: Self-pay | Admitting: Family

## 2021-11-21 VITALS — BP 128/80 | HR 65 | Temp 97.1°F | Resp 16 | Ht 65.0 in | Wt 172.8 lb

## 2021-11-21 DIAGNOSIS — Z1211 Encounter for screening for malignant neoplasm of colon: Secondary | ICD-10-CM

## 2021-11-21 DIAGNOSIS — E785 Hyperlipidemia, unspecified: Secondary | ICD-10-CM

## 2021-11-21 DIAGNOSIS — K219 Gastro-esophageal reflux disease without esophagitis: Secondary | ICD-10-CM | POA: Diagnosis not present

## 2021-11-21 DIAGNOSIS — I10 Essential (primary) hypertension: Secondary | ICD-10-CM

## 2021-11-21 DIAGNOSIS — F411 Generalized anxiety disorder: Secondary | ICD-10-CM

## 2021-11-21 DIAGNOSIS — M25512 Pain in left shoulder: Secondary | ICD-10-CM

## 2021-11-21 DIAGNOSIS — R7303 Prediabetes: Secondary | ICD-10-CM

## 2021-11-21 NOTE — Patient Instructions (Signed)
-   Please get  left shoulder X-ray at [facility] at [address] then will call you with results.  

## 2021-11-21 NOTE — Progress Notes (Signed)
Provider: Marlowe Sax FNP-C   Alyiah Ulloa, Nelda Bucks, NP  Patient Care Team: Treysean Petruzzi, Nelda Bucks, NP as PCP - General (Family Medicine)  Extended Emergency Contact Information Primary Emergency Contact: Lasky,Dana Address: [address]          [place] [address] Johnnette Litter of [place] Phone: [phone] Relation: Spouse Secondary Emergency Contact: Erle Crocker Mobile Phone: [phone] Relation: Other  Code Status:  Full Code  Goals of care: Advanced Directive information    11/21/2021   10:36 AM  Advanced Directives  Does Patient Have a Medical Advance Directive? No  Does patient want to make changes to medical advance directive? No - Patient declined     Chief Complaint  Patient presents with   Medical Management of Chronic Issues    6 month follow up.   Health Maintenance    Discuss the need for Colonoscopy.    Immunizations    Discuss the need for Shingrix vaccine.    HPI:  Pt is a 66 y.o. male seen today for  6 months follow up for medical management of chronic diseases.    GERD - getting worst especially after eating even when does not eat fried or species. Symptoms worst even when sleeping upright.   Hypertension - blood pressure has been within normal range.   Hyperlipidemia - atorvastatin exercises sometimes at least 2 twice a week.   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 No pertinent surgical history.  No Known Allergies  Allergies as of 11/21/2021   No Known Allergies      Medication List        Accurate as of  November 21, 2021 10:46 AM. If you have any questions, ask your nurse or doctor.          amLODipine 5 MG tablet Commonly known as: NORVASC TAKE 1 TABLET BY MOUTH EVERY DAY   atorvastatin 20 MG tablet Commonly known as: LIPITOR TAKE 1 TABLET BY MOUTH EVERY DAY   citalopram 40 MG tablet Commonly known as: CELEXA TAKE 1 TABLET BY MOUTH EVERY DAY   hydrocortisone cream 0.5 % Apply 1 application topically 2 (two) times daily.   losartan 100 MG tablet Commonly known as: COZAAR TAKE 1 TABLET BY MOUTH EVERY DAY   OVER THE COUNTER MEDICATION Take 2 capsules by mouth at bedtime. CannaAid 76m Delta-8  167mCBN   pantoprazole 40 MG tablet Commonly known as: PROTONIX TAKE 1 TABLET BY MOUTH DAILY OR AS DIRECTED   sildenafil 20 MG tablet Commonly known as: REVATIO TAKE ONE TABLET BY MOUTH DAILY AS NEEDED ONE HOUR PRIOR        Review of Systems  Constitutional:  Negative for appetite change, chills, fatigue, fever and unexpected weight change.  HENT:  Negative for congestion, dental problem, ear discharge, ear pain, facial swelling, hearing loss, nosebleeds, postnasal drip, rhinorrhea, sinus pressure, sinus pain, sneezing, sore throat, tinnitus and trouble swallowing.   Eyes:  Negative for pain, discharge, redness, itching and visual disturbance.  Respiratory:  Negative for cough, chest tightness, shortness of breath and wheezing.   Cardiovascular:  Negative for chest pain, palpitations and leg swelling.  Gastrointestinal:  Negative for abdominal distention, abdominal pain, blood in stool, constipation, diarrhea, nausea and vomiting.  Endocrine: Negative for cold intolerance, heat intolerance, polydipsia, polyphagia and polyuria.  Genitourinary:  Negative for difficulty urinating, dysuria, flank pain, frequency and urgency.  Musculoskeletal:  Negative for arthralgias, back pain, gait problem, joint swelling, myalgias, neck pain and neck stiffness.  Skin:  Negative for color change,  pallor, rash and wound.  Neurological:  Negative for dizziness, syncope, speech difficulty, weakness, light-headedness, numbness and headaches.  Hematological:  Does not bruise/bleed easily.  Psychiatric/Behavioral:  Negative for agitation, behavioral problems, confusion, hallucinations, self-injury, sleep disturbance and suicidal ideas. The patient is not nervous/anxious.     Immunization History  Administered Date(s) Administered   Influenza Split 04/13/2008, 05/19/2009, 03/02/2010, 04/19/2011, 04/04/2015   Influenza, Quadrivalent, Recombinant, Inj, Pf 05/19/2019   Influenza,inj,Quad PF,6+ Mos 02/11/2014, 03/15/2016, 05/21/2017, 07/05/2020, 05/16/2021   Influenza,inj,Quad PF,6-35 Mos 05/09/2018   Influenza-Unspecified 02/25/2012, 03/31/2013   PFIZER(Purple Top)SARS-COV-2 Vaccination 08/04/2019, 09/10/2019, 01/03/2020, 03/11/2020   Pfizer Covid-19 Vaccine Bivalent Booster 82yr & up 05/31/2021   Tdap 12/26/2010, 05/17/2021   Zoster, Live 08/07/2011   Pertinent  Health Maintenance Due  Topic Date Due   COLONOSCOPY (Pts 45-435yrInsurance coverage will need to be confirmed)  Never done   INFLUENZA VACCINE  01/02/2022      07/05/2020    1:43 PM 10/19/2020   10:32 AM 11/15/2020   10:05 AM 05/16/2021   10:04 AM 11/21/2021   10:36 AM  Fall Risk  Falls in the past year? 0 0 0 0 0  Was there an injury with Fall? 0 0 0 0 0  Fall Risk Category Calculator 0 0 0 0 0  Fall Risk Category Low Low Low Low Low  Patient Fall Risk Level Low fall risk Low fall risk Low fall risk Low fall risk Low fall risk  Patient at Risk for Falls Due to    No Fall Risks No Fall Risks  Fall risk Follow up    Falls evaluation completed Falls evaluation completed   Functional Status Survey:    Vitals:   11/21/21 1027  BP: 128/80  Pulse: 65  Resp: 16  Temp: (!) 97.1 F (36.2 C)  SpO2: 98%  Weight: 172 lb 12.8 oz (78.4 kg)  Height: '5\' 5"'  (1.651 m)   Body mass index is 28.76 kg/m. Physical Exam Vitals  reviewed.  Constitutional:      General: He is not in acute distress.    Appearance: Normal appearance. He is normal weight. He is not ill-appearing or diaphoretic.  HENT:     Head: Normocephalic.     Right Ear: Tympanic membrane, ear canal and external ear normal. There is no impacted cerumen.     Left Ear: Tympanic membrane, ear canal and external ear normal. There is no impacted cerumen.     Nose: Nose normal. No congestion or rhinorrhea.     Mouth/Throat:     Mouth: Mucous membranes are moist.     Pharynx: Oropharynx is clear. No oropharyngeal exudate or posterior oropharyngeal erythema.  Eyes:     General: No scleral icterus.       Right eye: No discharge.        Left eye: No discharge.     Extraocular Movements: Extraocular movements intact.     Conjunctiva/sclera: Conjunctivae normal.     Pupils:  Pupils are equal, round, and reactive to light.  Neck:     Vascular: No carotid bruit.  Cardiovascular:     Rate and Rhythm: Normal rate and regular rhythm.     Pulses: Normal pulses.     Heart sounds: Normal heart sounds. No murmur heard.    No friction rub. No gallop.  Pulmonary:     Effort: Pulmonary effort is normal. No respiratory distress.     Breath sounds: Normal breath sounds. No wheezing, rhonchi or rales.  Chest:     Chest wall: No tenderness.  Abdominal:     General: Bowel sounds are normal. There is no distension.     Palpations: Abdomen is soft. There is no mass.     Tenderness: There is no abdominal tenderness. There is no right CVA tenderness, left CVA tenderness, guarding or rebound.  Musculoskeletal:        General: No swelling or tenderness. Normal range of motion.     Cervical back: Normal range of motion. No rigidity or tenderness.     Right lower leg: No edema.     Left lower leg: No edema.  Lymphadenopathy:     Cervical: No cervical adenopathy.  Skin:    General: Skin is warm and dry.     Coloration: Skin is not pale.     Findings: No bruising,  erythema, lesion or rash.  Neurological:     Mental Status: He is alert and oriented to person, place, and time.     Cranial Nerves: No cranial nerve deficit.     Sensory: No sensory deficit.     Motor: No weakness.     Coordination: Coordination normal.     Gait: Gait normal.  Psychiatric:        Mood and Affect: Mood normal.        Speech: Speech normal.        Behavior: Behavior normal.        Thought Content: Thought content normal.        Judgment: Judgment normal.     Labs reviewed: Recent Labs    05/15/21 0941  NA 140  K 3.9  CL 107  CO2 25  GLUCOSE 92  BUN 11  CREATININE 0.95  CALCIUM 8.7   Recent Labs    05/15/21 0941  AST 15  ALT 12  BILITOT 0.8  PROT 6.7   Recent Labs    05/15/21 0941  WBC 4.1  NEUTROABS 2,218  HGB 13.4  HCT 41.1  MCV 90.5  PLT 245   Lab Results  Component Value Date   TSH 4.30 05/15/2021   Lab Results  Component Value Date   HGBA1C 5.6 07/05/2020   Lab Results  Component Value Date   CHOL 193 05/15/2021   HDL 51 05/15/2021   LDLCALC 124 (H) 05/15/2021   TRIG 83 05/15/2021   CHOLHDL 3.8 05/15/2021    Significant Diagnostic Results in last 30 days:  No results found.  Assessment/Plan 1. Screening for colon cancer Has upcoming colonoscopy appointment with [facility] on 01/05/2022   2. Primary hypertension B/p well controlled  - continue on losartan and amlodipine  - TSH - CMP with eGFR([facility]) - CBC with Differential/Platelet  3. Hyperlipidemia LDL goal <100 Previous LDL 124 - continue on atorvastatin.no side effects reported  - continue with dietary modification and exercise   - Lipid panel  4. Gastroesophageal reflux disease without esophagitis Symptoms have worsen despite avoiding fried foods and sleeping in upright position.  Will have him follow up with [facility] for further evaluation  - Ambulatory referral to Gastroenterology  5. Prediabetes Lab Results  Component Value Date   HGBA1C  5.6 07/05/2020  Continue with dietary modification and exercise. Has lost weight 8 lbs since previous visit.  - Hemoglobin A1c  6. Generalized anxiety disorder Stable  - continue on citalopram   7. Left shoulder pain, unspecified chronicity Ongoing x 1 months occurred after reaching up to remove a suitcase.Pain radiates down to upper arm and has some numbness and tingling sensation on forearm. Will obtain imaging then refer to Orthopedic if indicated. - advised to avoid reaching overhead.  - DG Shoulder Left; Future  Family/ staff Communication: Reviewed plan of care with patient verbalized understanding   Labs/tests ordered:  - CBC with Differential/Platelet - CMP with eGFR([facility]) - TSH - Hgb A1C - Lipid panel  Next Appointment : Return in about 6 months (around 05/23/2022) for medical mangement of chronic issues.Sandrea Hughs, NP

## 2021-11-22 ENCOUNTER — Other Ambulatory Visit: Payer: Self-pay | Admitting: Family

## 2021-11-22 DIAGNOSIS — M25512 Pain in left shoulder: Secondary | ICD-10-CM

## 2021-11-22 LAB — CBC WITH DIFFERENTIAL/PLATELET
Absolute Monocytes: 410 cells/uL (ref 200–950)
Basophils Absolute: 32 cells/uL (ref 0–200)
Basophils Relative: 0.7 %
Eosinophils Absolute: 81 cells/uL (ref 15–500)
Eosinophils Relative: 1.8 %
HCT: 41.2 % (ref 38.5–50.0)
Hemoglobin: 13.4 g/dL (ref 13.2–17.1)
Lymphs Abs: 1782 cells/uL (ref 850–3900)
MCH: 29.6 pg (ref 27.0–33.0)
MCHC: 32.5 g/dL (ref 32.0–36.0)
MCV: 91.2 fL (ref 80.0–100.0)
MPV: 11.4 fL (ref 7.5–12.5)
Monocytes Relative: 9.1 %
Neutro Abs: 2196 cells/uL (ref 1500–7800)
Neutrophils Relative %: 48.8 %
Platelets: 231 10*3/uL (ref 140–400)
RBC: 4.52 10*6/uL (ref 4.20–5.80)
RDW: 12.9 % (ref 11.0–15.0)
Total Lymphocyte: 39.6 %
WBC: 4.5 10*3/uL (ref 3.8–10.8)

## 2021-11-22 LAB — COMPLETE METABOLIC PANEL WITH GFR
AG Ratio: 1.3 (calc) (ref 1.0–2.5)
ALT: 14 U/L (ref 9–46)
AST: 17 U/L (ref 10–35)
Albumin: 3.9 g/dL (ref 3.6–5.1)
Alkaline phosphatase (APISO): 91 U/L (ref 35–144)
BUN: 12 mg/dL (ref 7–25)
CO2: 28 mmol/L (ref 20–32)
Calcium: 9.5 mg/dL (ref 8.6–10.3)
Chloride: 105 mmol/L (ref 98–110)
Creat: 1.07 mg/dL (ref 0.70–1.35)
Globulin: 3 g/dL (calc) (ref 1.9–3.7)
Glucose, Bld: 90 mg/dL (ref 65–99)
Potassium: 4.4 mmol/L (ref 3.5–5.3)
Sodium: 141 mmol/L (ref 135–146)
Total Bilirubin: 1.1 mg/dL (ref 0.2–1.2)
Total Protein: 6.9 g/dL (ref 6.1–8.1)
eGFR: 77 mL/min/{1.73_m2} (ref >=60)

## 2021-11-22 LAB — LIPID PANEL
Cholesterol: 115 mg/dL (ref <200)
HDL: 49 mg/dL (ref >=40)
LDL Cholesterol (Calc): 50 mg/dL (calc)
Non-HDL Cholesterol (Calc): 66 mg/dL (calc) (ref <130)
Total CHOL/HDL Ratio: 2.3 (calc) (ref <5.0)
Triglycerides: 77 mg/dL (ref <150)

## 2021-11-22 LAB — HEMOGLOBIN A1C
Hgb A1c MFr Bld: 5.4 % of total Hgb (ref <5.7)
Mean Plasma Glucose: 108 mg/dL
eAG (mmol/L): 6 mmol/L

## 2021-11-22 LAB — TSH: TSH: 2.97 mIU/L (ref 0.40–4.50)

## 2021-11-22 NOTE — Progress Notes (Signed)
Orthopedic referral to Orthopedic at [facility] placed as requested

## 2021-11-23 ENCOUNTER — Ambulatory Visit (INDEPENDENT_AMBULATORY_CARE_PROVIDER_SITE_OTHER): Payer: 59 | Admitting: Orthopaedic Surgery

## 2021-11-23 ENCOUNTER — Ambulatory Visit (INDEPENDENT_AMBULATORY_CARE_PROVIDER_SITE_OTHER): Payer: 59

## 2021-11-23 DIAGNOSIS — M7582 Other shoulder lesions, left shoulder: Secondary | ICD-10-CM

## 2021-11-23 DIAGNOSIS — G8929 Other chronic pain: Secondary | ICD-10-CM

## 2021-11-23 DIAGNOSIS — M25512 Pain in left shoulder: Secondary | ICD-10-CM | POA: Diagnosis not present

## 2021-11-23 MED ORDER — TRIAMCINOLONE ACETONIDE 40 MG/ML IJ SUSP
80.0000 mg | INTRAMUSCULAR | Status: AC | PRN
  Administered 2021-11-23: 80 mg via INTRA_ARTICULAR

## 2021-11-23 MED ORDER — LIDOCAINE HCL 1 % IJ SOLN
4.0000 mL | INTRAMUSCULAR | Status: AC | PRN
  Administered 2021-11-23: 4 mL

## 2022-01-04 ENCOUNTER — Other Ambulatory Visit: Payer: Self-pay | Admitting: Family

## 2022-01-11 ENCOUNTER — Other Ambulatory Visit: Payer: Self-pay | Admitting: Family

## 2022-01-12 NOTE — Telephone Encounter (Signed)
Patient has request refill on medication Sidenafil 20mg . Patient medication last refilled 09/06/2021. Patient was given 10 tablets and 1 refill. Patient medication pend and sent to provider for further refills.

## 2022-01-14 ENCOUNTER — Other Ambulatory Visit: Payer: Self-pay | Admitting: Family

## 2022-02-05 ENCOUNTER — Other Ambulatory Visit: Payer: Self-pay

## 2022-02-05 ENCOUNTER — Emergency Department (HOSPITAL_BASED_OUTPATIENT_CLINIC_OR_DEPARTMENT_OTHER): Payer: Commercial Managed Care - HMO

## 2022-02-05 ENCOUNTER — Emergency Department (HOSPITAL_BASED_OUTPATIENT_CLINIC_OR_DEPARTMENT_OTHER)
Admission: EM | Admit: 2022-02-05 | Discharge: 2022-02-05 | Disposition: A | Discharge status: Home / Self Care | Payer: Commercial Managed Care - HMO | Attending: Emergency Medicine | Admitting: Emergency Medicine

## 2022-02-05 ENCOUNTER — Telehealth: Payer: Self-pay | Admitting: Pulmonary Disease

## 2022-02-05 ENCOUNTER — Encounter (HOSPITAL_BASED_OUTPATIENT_CLINIC_OR_DEPARTMENT_OTHER): Payer: Self-pay | Admitting: Radiology

## 2022-02-05 DIAGNOSIS — R109 Unspecified abdominal pain: Secondary | ICD-10-CM | POA: Insufficient documentation

## 2022-02-05 DIAGNOSIS — R0602 Shortness of breath: Secondary | ICD-10-CM | POA: Insufficient documentation

## 2022-02-05 DIAGNOSIS — I10 Essential (primary) hypertension: Secondary | ICD-10-CM | POA: Diagnosis not present

## 2022-02-05 DIAGNOSIS — R591 Generalized enlarged lymph nodes: Secondary | ICD-10-CM

## 2022-02-05 DIAGNOSIS — R531 Weakness: Secondary | ICD-10-CM | POA: Insufficient documentation

## 2022-02-05 DIAGNOSIS — Z79899 Other long term (current) drug therapy: Secondary | ICD-10-CM | POA: Diagnosis not present

## 2022-02-05 DIAGNOSIS — R918 Other nonspecific abnormal finding of lung field: Secondary | ICD-10-CM | POA: Diagnosis not present

## 2022-02-05 DIAGNOSIS — M549 Dorsalgia, unspecified: Secondary | ICD-10-CM | POA: Diagnosis not present

## 2022-02-05 DIAGNOSIS — R590 Localized enlarged lymph nodes: Secondary | ICD-10-CM | POA: Diagnosis not present

## 2022-02-05 LAB — URINALYSIS, ROUTINE W REFLEX MICROSCOPIC
Bilirubin Urine: NEGATIVE
Glucose, UA: NEGATIVE mg/dL
Hgb urine dipstick: NEGATIVE
Ketones, ur: NEGATIVE mg/dL
Leukocytes,Ua: NEGATIVE
Nitrite: NEGATIVE
Protein, ur: NEGATIVE mg/dL
Specific Gravity, Urine: >1.046 — ABNORMAL HIGH (ref 1.005–1.030)
pH: 7.5 (ref 5.0–8.0)

## 2022-02-05 LAB — DIFFERENTIAL
Abs Immature Granulocytes: 0.02 10*3/uL (ref 0.00–0.07)
Basophils Absolute: 0 10*3/uL (ref 0.0–0.1)
Basophils Relative: 1 %
Eosinophils Absolute: 0 10*3/uL (ref 0.0–0.5)
Eosinophils Relative: 1 %
Immature Granulocytes: 0 %
Lymphocytes Relative: 24 %
Lymphs Abs: 1.4 10*3/uL (ref 0.7–4.0)
Monocytes Absolute: 0.6 10*3/uL (ref 0.1–1.0)
Monocytes Relative: 11 %
Neutro Abs: 3.8 10*3/uL (ref 1.7–7.7)
Neutrophils Relative %: 63 %

## 2022-02-05 LAB — CBC
HCT: 42.2 % (ref 39.0–52.0)
Hemoglobin: 13.8 g/dL (ref 13.0–17.0)
MCH: 30.2 pg (ref 26.0–34.0)
MCHC: 32.7 g/dL (ref 30.0–36.0)
MCV: 92.3 fL (ref 80.0–100.0)
Platelets: 303 10*3/uL (ref 150–400)
RBC: 4.57 MIL/uL (ref 4.22–5.81)
RDW: 14.3 % (ref 11.5–15.5)
WBC: 5.9 10*3/uL (ref 4.0–10.5)
nRBC: 0 % (ref 0.0–0.2)

## 2022-02-05 LAB — COMPREHENSIVE METABOLIC PANEL
ALT: 29 U/L (ref 0–44)
AST: 23 U/L (ref 15–41)
Albumin: 4.4 g/dL (ref 3.5–5.0)
Alkaline Phosphatase: 89 U/L (ref 38–126)
Anion gap: 10 (ref 5–15)
BUN: 9 mg/dL (ref 8–23)
CO2: 28 mmol/L (ref 22–32)
Calcium: 10.1 mg/dL (ref 8.9–10.3)
Chloride: 102 mmol/L (ref 98–111)
Creatinine, Ser: 1.04 mg/dL (ref 0.61–1.24)
GFR, Estimated: >60 mL/min (ref >60)
Glucose, Bld: 116 mg/dL — ABNORMAL HIGH (ref 70–99)
Potassium: 3.9 mmol/L (ref 3.5–5.1)
Sodium: 140 mmol/L (ref 135–145)
Total Bilirubin: 1.3 mg/dL — ABNORMAL HIGH (ref 0.3–1.2)
Total Protein: 8.4 g/dL — ABNORMAL HIGH (ref 6.5–8.1)

## 2022-02-05 LAB — PROTIME-INR
INR: 1 (ref 0.8–1.2)
Prothrombin Time: 13.1 seconds (ref 11.4–15.2)

## 2022-02-05 LAB — RAPID URINE DRUG SCREEN, HOSP PERFORMED
Amphetamines: NOT DETECTED
Barbiturates: NOT DETECTED
Benzodiazepines: NOT DETECTED
Cocaine: NOT DETECTED
Opiates: NOT DETECTED
Tetrahydrocannabinol: POSITIVE — AB

## 2022-02-05 LAB — APTT: aPTT: 32 seconds (ref 24–36)

## 2022-02-05 LAB — TROPONIN I (HIGH SENSITIVITY)
Troponin I (High Sensitivity): 2 ng/L (ref <18)
Troponin I (High Sensitivity): 3 ng/L (ref <18)

## 2022-02-05 LAB — ETHANOL: Alcohol, Ethyl (B): <10 mg/dL (ref <10)

## 2022-02-05 MED ORDER — LIDOCAINE 5 % EX PTCH: 1.0000 | MEDICATED_PATCH | CUTANEOUS | 0 refills | Status: DC

## 2022-02-05 MED ORDER — OXYCODONE HCL 5 MG PO TABS: 5.0000 mg | ORAL_TABLET | ORAL | 0 refills | Status: DC | PRN

## 2022-02-05 MED ORDER — FENTANYL CITRATE PF 50 MCG/ML IJ SOSY
50.0000 ug | PREFILLED_SYRINGE | Freq: Once | INTRAMUSCULAR | Status: AC
  Administered 2022-02-05: 50 ug via INTRAVENOUS
  Filled 2022-02-05: qty 1

## 2022-02-05 MED ORDER — IOHEXOL 350 MG/ML SOLN
100.0000 mL | Freq: Once | INTRAVENOUS | Status: AC | PRN
  Administered 2022-02-05: 85 mL via INTRAVENOUS

## 2022-02-05 MED ORDER — DOXYCYCLINE HYCLATE 100 MG PO CAPS: 100.0000 mg | ORAL_CAPSULE | Freq: Two times a day (BID) | ORAL | 0 refills | Status: DC

## 2022-02-05 NOTE — Telephone Encounter (Signed)
Patient seen at [facility] ER on 9/4 for flank pain, found to have RLL mass.  Needs urgent appointment with [facility].  Will notify advanced bronchoscopy team.

## 2022-02-05 NOTE — Discharge Instructions (Addendum)
For your pain, you may take up to 1000mg of acetaminophen (tylenol) 4 times daily for up to a week. This is the maximum dose of acetminophen (tylenol) you can take from all sources. Please check other over-the-counter medications and prescriptions to ensure you are not taking other medications that contain acetaminophen.  You may also take ibuprofen 400 mg 6 times a day OR 600mg 4 times a day alternating with or at the same time as tylenol.  Take oxycodone as needed for breakthrough pain.  This medication can be addicting, sedating and cause constipation.    

## 2022-02-05 NOTE — ED Provider Triage Note (Signed)
Emergency Medicine Provider Triage Evaluation Note  Austin Gibbs , a 66 y.o. male  was evaluated in triage.  He has a history of hypertension, on multiple medications.  Pt complains of acute onset of severe right shoulder blade pain starting this morning.  He reports some blurry vision and tearing of his eyes, no loss of vision or double vision.  States that his right lower extremity feels weak.  No abdominal pain.  States that he is taking his blood pressure medications and has not missed any doses.  Review of Systems  Positive: Back pain Negative: Loss of vision  Physical Exam  BP (!) 198/109 (BP Location: Left Arm)   Pulse 81   Temp 98.5 F (36.9 C)   Resp 16   Ht 5\' 5"  (1.651 m)   Wt 79.4 kg   SpO2 100%   BMI 29.12 kg/m  Gen:   Awake, appears uncomfortable, eyes tearing Resp:  Normal effort  MSK:   Moves extremities without difficulty  Other:  Upper and lower extremity strength grossly intact, 2+ pedal pulses, 2+ radial pulses bilaterally.   Medical Decision Making  Medically screening exam initiated at 11:32 AM.  Appropriate orders placed.  Austin Gibbs was informed that the remainder of the evaluation will be completed by another provider, this initial triage assessment does not replace that evaluation, and the importance of remaining in the ED until their evaluation is complete.     [name], PA-C 02/05/22 1133

## 2022-02-05 NOTE — ED Triage Notes (Signed)
Patient arrives with complaints of back pain, shortness of breath, blurry vision, and hypertension (170s systolic at home). Patient woke up feeling this way at 0800.  Rates pain a 10/10.  Arlyss Repress, PA to triage to assess patient.

## 2022-02-05 NOTE — ED Provider Notes (Signed)
[facility] EMERGENCY DEPT Provider Note   CSN: [id number] Arrival date & time: 02/05/22  1121     History {Add pertinent medical, surgical, social history, OB history to HPI:1} Chief Complaint  Patient presents with   Hypertension   Back Pain   Shortness of Breath    SABRE LEONETTI is a 66 y.o. male.  HPI       66yo male with history of htn, hlpd, GERD, RLS,  Past Medical History:  Diagnosis Date   Erectile dysfunction    GERD (gastroesophageal reflux disease)    Hypercholesteremia    Hypertension    Insomnia    Neuromuscular disorder (HCC)    Restless leg syndrome    Phobic anxiety disorder    fear of driving over bridges   Restless leg syndrome      Home Medications Prior to Admission medications   Medication Sig Start Date End Date Taking? Authorizing Provider  amLODipine (NORVASC) 5 MG tablet TAKE 1 TABLET BY MOUTH EVERY DAY 05/22/21   Ngetich, Dinah C, NP  atorvastatin (LIPITOR) 20 MG tablet TAKE 1 TABLET BY MOUTH EVERY DAY 01/05/22   Ngetich, Dinah C, NP  citalopram (CELEXA) 40 MG tablet TAKE 1 TABLET BY MOUTH EVERY DAY 05/22/21   Ngetich, Dinah C, NP  hydrocortisone cream 0.5 % Apply 1 application topically 2 (two) times daily. 07/05/20   Ngetich, Dinah C, NP  losartan (COZAAR) 100 MG tablet TAKE 1 TABLET BY MOUTH EVERY DAY 01/15/22   Ngetich, Dinah C, NP  OVER THE COUNTER MEDICATION Take 2 capsules by mouth at bedtime. CannaAid 15mg  Delta-8  10mg  CBN    [provider]  pantoprazole (PROTONIX) 40 MG tablet TAKE 1 TABLET BY MOUTH DAILY OR AS DIRECTED 08/23/21   Ngetich, Dinah C, NP  sildenafil (REVATIO) 20 MG tablet TAKE ONE TABLET BY MOUTH DAILY ONE HOUR PRIOR 01/12/22   Ngetich, 08/25/21, NP      Allergies    Patient has no known allergies.    Review of Systems   Review of Systems  Physical Exam Updated Vital Signs BP (!) 198/109 (BP Location: Left Arm)   Pulse 81   Temp 98.5 F (36.9 C)   Resp 16   Ht 5\' 5"  (1.651 m)   Wt  79.4 kg   SpO2 100%   BMI 29.12 kg/m  Physical Exam  ED Results / Procedures / Treatments   Labs (all labs ordered are listed, but only abnormal results are displayed) Labs Reviewed  PROTIME-INR  APTT  CBC  DIFFERENTIAL  ETHANOL  COMPREHENSIVE METABOLIC PANEL  RAPID URINE DRUG SCREEN, HOSP PERFORMED  URINALYSIS, ROUTINE W REFLEX MICROSCOPIC    EKG None  Radiology No results found.  Procedures Procedures  {Document cardiac monitor, telemetry assessment procedure when appropriate:1}  Medications Ordered in ED Medications  iohexol (OMNIPAQUE) 350 MG/ML injection 100 mL (85 mLs Intravenous Contrast Given 02/05/22 1148)    ED Course/ Medical Decision Making/ A&P                           Medical Decision Making  ***  {Document critical care time when appropriate:1} {Document review of labs and clinical decision tools ie heart score, Chads2Vasc2 etc:1}  {Document your independent review of radiology images, and any outside records:1} {Document your discussion with family members, caretakers, and with consultants:1} {Document social determinants of health affecting pt's care:1} {Document your decision making why or why not admission, treatments  were needed:1} Final Clinical Impression(s) / ED Diagnoses Final diagnoses:  None    Rx / DC Orders ED Discharge Orders     None

## 2022-02-06 NOTE — Telephone Encounter (Signed)
Spoke with the pt and scheduled appt with Dr Delton Coombes for 02/07/22 at 1:30 pm Office address provided Nothing further needed

## 2022-02-06 NOTE — Telephone Encounter (Signed)
Ct reviewed. Please get pt into first available slot w RB, BI or NM. Ok to use nodule slot

## 2022-02-07 ENCOUNTER — Encounter: Payer: Self-pay | Admitting: Emergency Medicine

## 2022-02-07 ENCOUNTER — Ambulatory Visit: Payer: Commercial Managed Care - HMO | Admitting: Emergency Medicine

## 2022-02-07 VITALS — BP 176/78 | HR 82 | Temp 98.6°F | Ht 65.0 in | Wt 171.4 lb

## 2022-02-07 DIAGNOSIS — R918 Other nonspecific abnormal finding of lung field: Secondary | ICD-10-CM | POA: Diagnosis not present

## 2022-02-07 DIAGNOSIS — Z01812 Encounter for preprocedural laboratory examination: Secondary | ICD-10-CM

## 2022-02-07 NOTE — Progress Notes (Signed)
 Subjective:    Patient ID: Austin Gibbs, male    DOB: 06/25/1955, 65 y.o.   MRN: [id number]  HPI 65-year-old never smoker with history of hyperlipidemia, hypertension, restless leg syndrome, GERD.  He was evaluated in the ED on 02/05/2022 for sudden onset of severe right-sided back pain near his scapula that radiated to the front of his chest.  His lab work was reassuring, right upper quadrant ultrasound reassuring.  He underwent a CT chest abdomen pelvis as below.  He is referred for newly identified right lower lobe masslike opacity. Feeling well. No fevers, sputum. He does have some cough and has experienced some R flank pain and back pain with movement and sometimes cough, deep breath.   CT angio chest/abdomen/pelvis 02/05/2022 reviewed by me shows no evidence of pulmonary embolism, there was some lymphadenopathy noted along the distal esophagus near the GE junction largest 7 mm with some mild circumferential distal esophageal wall thickening.  There was a 3.6 x 2.7 medial right lower lobe paraspinal masslike opacity.  Also noted was an enlarged right hilar node at station 4R   Review of Systems As per HPI  Past Medical History:  Diagnosis Date   Erectile dysfunction    GERD (gastroesophageal reflux disease)    Hypercholesteremia    Hypertension    Insomnia    Neuromuscular disorder (HCC)    Restless leg syndrome    Phobic anxiety disorder    fear of driving over bridges   Restless leg syndrome      Family History  Problem Relation Age of Onset   COPD Father 85   Hyperlipidemia Mother 87   Heart attack Maternal Grandfather        Died in 80s from heart attack   Heart attack Paternal Grandfather        Died in 80s from heart attack     Social History   Socioeconomic History   Marital status: Married    Spouse name: Not on file   Number of children: Not on file   Years of education: Not on file   Highest education level: Not on file  Occupational History   Not on  file  Tobacco Use   Smoking status: Never   Smokeless tobacco: Never  Vaping Use   Vaping Use: Never used  Substance and Sexual Activity   Alcohol use: Yes    Comment: Occasional margarita on weekends.   Drug use: No   Sexual activity: Not Currently  Other Topics Concern   Not on file  Social History Narrative   Diet: Blank      Do you drink/ eat things with caffeine? Coffee      Marital status:   Married                            What year were you married ? 2001      Do you live in a house, apartment,assistred living, condo, trailer, etc.)? House      Is it one or more stories? One      How many persons live in your home ? 3      Do you have any pets in your home ?(please list) Dog      Highest Level of education completed:2 yrs. College      Current or past profession:  [employer]      Do you exercise?  Nope                              Type & how often Blank      ADVANCED DIRECTIVES (Please bring copies)      Do you have a living will? No      Do you have a DNR form?    No                   If not, do you want to discuss one? Yes      Do you have signed POA?HPOA forms?   No              If so, please bring to your appointment      FUNCTIONAL STATUS- To be completed by Spouse / child / Staff       Do you have difficulty bathing or dressing yourself ?  No      Do you have difficulty preparing food or eating ?  No      Do you have difficulty managing your mediation ? No      Do you have difficulty managing your finances ?  No      Do you have difficulty affording your medication ?  No      Blank   Social Determinants of Corporate investment banker Strain: Not on file  Food Insecurity: Not on file  Transportation Needs: Not on file  Physical Activity: Not on file  Stress: Not on file  Social Connections: Not on file  Intimate Partner Violence: Not on file     No Known Allergies   Outpatient Medications Prior to Visit  Medication Sig Dispense Refill    amLODipine (NORVASC) 5 MG tablet TAKE 1 TABLET BY MOUTH EVERY DAY 90 tablet 1   atorvastatin (LIPITOR) 20 MG tablet TAKE 1 TABLET BY MOUTH EVERY DAY 90 tablet 1   doxycycline (VIBRAMYCIN) 100 MG capsule Take 1 capsule (100 mg total) by mouth 2 (two) times daily for 7 days. 14 capsule 0   hydrocortisone cream 0.5 % Apply 1 application topically 2 (two) times daily. 30 g 0   lidocaine (LIDODERM) 5 % Place 1 patch onto the skin daily. Remove & Discard patch within 12 hours or as directed by MD 30 patch 0   losartan (COZAAR) 100 MG tablet TAKE 1 TABLET BY MOUTH EVERY DAY 90 tablet 1   OVER THE COUNTER MEDICATION Take 2 capsules by mouth at bedtime. CannaAid 15mg  Delta-8  10mg  CBN     pantoprazole (PROTONIX) 40 MG tablet TAKE 1 TABLET BY MOUTH DAILY OR AS DIRECTED 90 tablet 1   sildenafil (REVATIO) 20 MG tablet TAKE ONE TABLET BY MOUTH DAILY ONE HOUR PRIOR 10 tablet 1   citalopram (CELEXA) 40 MG tablet TAKE 1 TABLET BY MOUTH EVERY DAY (Patient not taking: Reported on 02/07/2022) 90 tablet 1   oxyCODONE (ROXICODONE) 5 MG immediate release tablet Take 1 tablet (5 mg total) by mouth every 4 (four) hours as needed for severe pain. 30 tablet 0   No facility-administered medications prior to visit.         Objective:   Physical Exam Vitals:   02/07/22 1337  BP: (!) 176/78  Pulse: 82  Temp: 98.6 F (37 C)  TempSrc: Oral  SpO2: 96%  Weight: 171 lb 6.4 oz (77.7 kg)  Height: 5\' 5"  (1.651 m)   Gen: Pleasant, well-nourished, in no distress,  normal affect  ENT: No lesions,  mouth clear,  oropharynx clear, no postnasal drip  Neck: No JVD, no stridor  Lungs: No use of accessory muscles,  no crackles or wheezing on normal respiration, no wheeze on forced expiration  Cardiovascular: RRR, heart sounds normal, no murmur or gallops, no peripheral edema  Musculoskeletal: No deformities, no cyanosis or clubbing  Neuro: alert, awake, non focal  Skin: Warm, no lesions or rash      Assessment &  Plan:  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  Etiology unclear.  He did not have an infectious prodrome or any symptoms consistent with pneumonia.  He is being treated empirically with doxycycline.  Talk to him today about conservative follow-up with serial imaging versus bronchoscopy.  I recommended trying to obtain a tissue diagnosis upfront.  He agrees.  We will work on setting up a navigational bronchoscopy with endobronchial ultrasound  We reviewed your CT scan of the chest today. We will set up bronchoscopy to evaluate your right lung opacity and enlarged lymph node.  This will be done and an outpatient under general anesthesia at [facility] endoscopy.  You will need a designated driver.  We will try to get this scheduled for 02/12/2022. We will perform a repeat CT scan of your chest to use during the procedure. Follow with Dr Delton Coombes in 1 month or next available.    Levy Pupa, MD, PhD 02/07/2022, 2:09 PM  Pulmonary and Critical Care [phone] or if no answer before 7:00PM call [phone] For any issues after 7:00PM please call eLink [phone]

## 2022-02-07 NOTE — H&P (View-Only) (Signed)
Subjective:    Patient ID: Austin Gibbs, male    DOB: 1956/03/02, 66 y.o.   MRN: [id number]  HPI 66 year old never smoker with history of hyperlipidemia, hypertension, restless leg syndrome, GERD.  He was evaluated in the ED on 02/05/2022 for sudden onset of severe right-sided back pain near his scapula that radiated to the front of his chest.  His lab work was reassuring, right upper quadrant ultrasound reassuring.  He underwent a CT chest abdomen pelvis as below.  He is referred for newly identified right lower lobe masslike opacity. Feeling well. No fevers, sputum. He does have some cough and has experienced some R flank pain and back pain with movement and sometimes cough, deep breath.   CT angio chest/abdomen/pelvis 02/05/2022 reviewed by me shows no evidence of pulmonary embolism, there was some lymphadenopathy noted along the distal esophagus near the GE junction largest 7 mm with some mild circumferential distal esophageal wall thickening.  There was a 3.6 x 2.7 medial right lower lobe paraspinal masslike opacity.  Also noted was an enlarged right hilar node at station 4R   Review of Systems As per HPI  Past Medical History:  Diagnosis Date   Erectile dysfunction    GERD (gastroesophageal reflux disease)    Hypercholesteremia    Hypertension    Insomnia    Neuromuscular disorder (HCC)    Restless leg syndrome    Phobic anxiety disorder    fear of driving over bridges   Restless leg syndrome      Family History  Problem Relation Age of Onset   COPD Father 75   Hyperlipidemia Mother 82   Heart attack Maternal Grandfather        Died in 34s from heart attack   Heart attack Paternal Grandfather        Died in 22s from heart attack     Social History   Socioeconomic History   Marital status: Married    Spouse name: Not on file   Number of children: Not on file   Years of education: Not on file   Highest education level: Not on file  Occupational History   Not on  file  Tobacco Use   Smoking status: Never   Smokeless tobacco: Never  Vaping Use   Vaping Use: Never used  Substance and Sexual Activity   Alcohol use: Yes    Comment: Occasional margarita on weekends.   Drug use: No   Sexual activity: Not Currently  Other Topics Concern   Not on file  Social History Narrative   Diet: Blank      Do you drink/ eat things with caffeine? Coffee      Marital status:   Married                            What year were you married ? 2001      Do you live in a house, apartment,assistred living, condo, trailer, etc.)? House      Is it one or more stories? One      How many persons live in your home ? 3      Do you have any pets in your home ?(please list) Dog      Highest Level of education completed:2 yrs. College      Current or past profession:  [employer]      Do you exercise?  Nope  Type & how often Blank      ADVANCED DIRECTIVES (Please bring copies)      Do you have a living will? No      Do you have a DNR form?    No                   If not, do you want to discuss one? Yes      Do you have signed POA?HPOA forms?   No              If so, please bring to your appointment      FUNCTIONAL STATUS- To be completed by Spouse / child / Staff       Do you have difficulty bathing or dressing yourself ?  No      Do you have difficulty preparing food or eating ?  No      Do you have difficulty managing your mediation ? No      Do you have difficulty managing your finances ?  No      Do you have difficulty affording your medication ?  No      Blank   Social Determinants of Corporate investment banker Strain: Not on file  Food Insecurity: Not on file  Transportation Needs: Not on file  Physical Activity: Not on file  Stress: Not on file  Social Connections: Not on file  Intimate Partner Violence: Not on file     No Known Allergies   Outpatient Medications Prior to Visit  Medication Sig Dispense Refill    amLODipine (NORVASC) 5 MG tablet TAKE 1 TABLET BY MOUTH EVERY DAY 90 tablet 1   atorvastatin (LIPITOR) 20 MG tablet TAKE 1 TABLET BY MOUTH EVERY DAY 90 tablet 1   doxycycline (VIBRAMYCIN) 100 MG capsule Take 1 capsule (100 mg total) by mouth 2 (two) times daily for 7 days. 14 capsule 0   hydrocortisone cream 0.5 % Apply 1 application topically 2 (two) times daily. 30 g 0   lidocaine (LIDODERM) 5 % Place 1 patch onto the skin daily. Remove & Discard patch within 12 hours or as directed by MD 30 patch 0   losartan (COZAAR) 100 MG tablet TAKE 1 TABLET BY MOUTH EVERY DAY 90 tablet 1   OVER THE COUNTER MEDICATION Take 2 capsules by mouth at bedtime. CannaAid 15mg  Delta-8  10mg  CBN     pantoprazole (PROTONIX) 40 MG tablet TAKE 1 TABLET BY MOUTH DAILY OR AS DIRECTED 90 tablet 1   sildenafil (REVATIO) 20 MG tablet TAKE ONE TABLET BY MOUTH DAILY ONE HOUR PRIOR 10 tablet 1   citalopram (CELEXA) 40 MG tablet TAKE 1 TABLET BY MOUTH EVERY DAY (Patient not taking: Reported on 02/07/2022) 90 tablet 1   oxyCODONE (ROXICODONE) 5 MG immediate release tablet Take 1 tablet (5 mg total) by mouth every 4 (four) hours as needed for severe pain. 30 tablet 0   No facility-administered medications prior to visit.         Objective:   Physical Exam Vitals:   02/07/22 1337  BP: (!) 176/78  Pulse: 82  Temp: 98.6 F (37 C)  TempSrc: Oral  SpO2: 96%  Weight: 171 lb 6.4 oz (77.7 kg)  Height: 5\' 5"  (1.651 m)   Gen: Pleasant, well-nourished, in no distress,  normal affect  ENT: No lesions,  mouth clear,  oropharynx clear, no postnasal drip  Neck: No JVD, no stridor  Lungs: No use of accessory muscles,  no crackles or wheezing on normal respiration, no wheeze on forced expiration  Cardiovascular: RRR, heart sounds normal, no murmur or gallops, no peripheral edema  Musculoskeletal: No deformities, no cyanosis or clubbing  Neuro: alert, awake, non focal  Skin: Warm, no lesions or rash      Assessment &  Plan:  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  Etiology unclear.  He did not have an infectious prodrome or any symptoms consistent with pneumonia.  He is being treated empirically with doxycycline.  Talk to him today about conservative follow-up with serial imaging versus bronchoscopy.  I recommended trying to obtain a tissue diagnosis upfront.  He agrees.  We will work on setting up a navigational bronchoscopy with endobronchial ultrasound  We reviewed your CT scan of the chest today. We will set up bronchoscopy to evaluate your right lung opacity and enlarged lymph node.  This will be done and an outpatient under general anesthesia at [facility] endoscopy.  You will need a designated driver.  We will try to get this scheduled for 02/12/2022. We will perform a repeat CT scan of your chest to use during the procedure. Follow with Dr Delton Coombes in 1 month or next available.    Levy Pupa, MD, PhD 02/07/2022, 2:09 PM  Pulmonary and Critical Care [phone] or if no answer before 7:00PM call [phone] For any issues after 7:00PM please call eLink [phone]

## 2022-02-07 NOTE — Assessment & Plan Note (Signed)
With associated mediastinal adenopathy.  Etiology unclear.  He did not have an infectious prodrome or any symptoms consistent with pneumonia.  He is being treated empirically with doxycycline.  Talk to him today about conservative follow-up with serial imaging versus bronchoscopy.  I recommended trying to obtain a tissue diagnosis upfront.  He agrees.  We will work on setting up a navigational bronchoscopy with endobronchial ultrasound  We reviewed your CT scan of the chest today. We will set up bronchoscopy to evaluate your right lung opacity and enlarged lymph node.  This will be done and an outpatient under general anesthesia at [facility] endoscopy.  You will need a designated driver.  We will try to get this scheduled for 02/12/2022. We will perform a repeat CT scan of your chest to use during the procedure. Follow with Dr Delton Coombes in 1 month or next available.

## 2022-02-07 NOTE — Patient Instructions (Signed)
We reviewed your CT scan of the chest today. We will set up bronchoscopy to evaluate your right lung opacity and enlarged lymph node.  This will be done and an outpatient under general anesthesia at [facility] endoscopy.  You will need a designated driver.  We will try to get this scheduled for 02/12/2022. We will perform a repeat CT scan of your chest to use during the procedure. Follow with Dr Delton Coombes in 1 month or next available.

## 2022-02-08 ENCOUNTER — Other Ambulatory Visit: Payer: Self-pay | Admitting: Emergency Medicine

## 2022-02-08 ENCOUNTER — Encounter (HOSPITAL_COMMUNITY): Payer: Self-pay

## 2022-02-08 ENCOUNTER — Encounter: Payer: Self-pay | Admitting: Emergency Medicine

## 2022-02-08 ENCOUNTER — Other Ambulatory Visit (HOSPITAL_COMMUNITY): Payer: Commercial Managed Care - HMO

## 2022-02-08 DIAGNOSIS — R918 Other nonspecific abnormal finding of lung field: Secondary | ICD-10-CM

## 2022-02-09 ENCOUNTER — Encounter (HOSPITAL_COMMUNITY): Payer: Self-pay | Admitting: Emergency Medicine

## 2022-02-09 ENCOUNTER — Other Ambulatory Visit: Payer: Self-pay

## 2022-02-09 ENCOUNTER — Other Ambulatory Visit: Payer: Commercial Managed Care - HMO

## 2022-02-09 ENCOUNTER — Telehealth: Payer: Self-pay | Admitting: Emergency Medicine

## 2022-02-09 NOTE — Telephone Encounter (Signed)
I reviewed the patient's CT chest in the navigation software. The pathways to his lesion are adequate. I think we can cancel the repeat scan this am. I called him and let him know not to go. I will call [facility] as well.

## 2022-02-09 NOTE — Progress Notes (Signed)
Spoke with pt for pre-op call. Pt has hx of HTN, denies cardiac history or diabetes.  Shower instructions given to pt and his wife and they voiced understanding.   Covid test to be done Monday on arrival.

## 2022-02-11 NOTE — Anesthesia Preprocedure Evaluation (Addendum)
Anesthesia Evaluation  Patient identified by MRN, date of birth, ID band Patient awake    Reviewed: Allergy & Precautions, NPO status , Patient's Chart, lab work & pertinent test results  Airway Mallampati: II  TM Distance: >3 FB Neck ROM: Full    Dental no notable dental hx. (+) Teeth Intact, Dental Advisory Given   Pulmonary neg pulmonary ROS,    Pulmonary exam normal breath sounds clear to auscultation       Cardiovascular hypertension, Pt. on medications Normal cardiovascular exam Rhythm:Regular Rate:Normal  TTE 2020 1. The left ventricle has normal systolic function, with an ejection  fraction of 55-60%. The cavity size was normal. There is mildly increased  left ventricular wall thickness. Left ventricular diastolic parameters  were normal.  2. The average left ventricular global longitudinal strain is -21.1 %.  3. The right ventricle has normal systolic function. The cavity was  normal. There is no increase in right ventricular wall thickness. Right  ventricular systolic pressure could not be assessed.  4. The aortic valve is tricuspid Aortic valve regurgitation was not  assessed by color flow Doppler  Stress Test 2020 Unremarkable    Neuro/Psych PSYCHIATRIC DISORDERS Anxiety negative neurological ROS     GI/Hepatic Neg liver ROS, GERD  Medicated and Controlled,  Endo/Other  negative endocrine ROS  Renal/GU negative Renal ROS  negative genitourinary   Musculoskeletal negative musculoskeletal ROS (+)   Abdominal   Peds  Hematology negative hematology ROS (+)   Anesthesia Other Findings   Reproductive/Obstetrics                           Anesthesia Physical Anesthesia Plan  ASA: 2  Anesthesia Plan: General   Post-op Pain Management: Minimal or no pain anticipated   Induction: Intravenous  PONV Risk Score and Plan: 2 and Midazolam, Dexamethasone and  Ondansetron  Airway Management Planned: Oral ETT  Additional Equipment:   Intra-op Plan:   Post-operative Plan: Extubation in OR  Informed Consent: I have reviewed the patients History and Physical, chart, labs and discussed the procedure including the risks, benefits and alternatives for the proposed anesthesia with the patient or authorized representative who has indicated his/her understanding and acceptance.     Dental advisory given  Plan Discussed with: CRNA  Anesthesia Plan Comments:         Anesthesia Quick Evaluation

## 2022-02-12 ENCOUNTER — Encounter (HOSPITAL_COMMUNITY): Payer: Self-pay | Admitting: Emergency Medicine

## 2022-02-12 ENCOUNTER — Other Ambulatory Visit: Payer: Self-pay

## 2022-02-12 ENCOUNTER — Ambulatory Visit (HOSPITAL_COMMUNITY): Payer: Commercial Managed Care - HMO

## 2022-02-12 ENCOUNTER — Ambulatory Visit (HOSPITAL_BASED_OUTPATIENT_CLINIC_OR_DEPARTMENT_OTHER): Payer: Commercial Managed Care - HMO | Admitting: Anesthesiology

## 2022-02-12 ENCOUNTER — Encounter (HOSPITAL_COMMUNITY)
Admission: RE | Disposition: A | Discharge status: Home / Self Care | Payer: Self-pay | Source: Ambulatory Visit | Attending: Emergency Medicine

## 2022-02-12 ENCOUNTER — Ambulatory Visit (HOSPITAL_COMMUNITY): Payer: Commercial Managed Care - HMO | Admitting: Anesthesiology

## 2022-02-12 ENCOUNTER — Ambulatory Visit (HOSPITAL_COMMUNITY)
Admission: RE | Admit: 2022-02-12 | Discharge: 2022-02-12 | Disposition: A | Discharge status: Home / Self Care | Payer: Commercial Managed Care - HMO | Source: Ambulatory Visit | Attending: Emergency Medicine | Admitting: Emergency Medicine

## 2022-02-12 DIAGNOSIS — Z20822 Contact with and (suspected) exposure to covid-19: Secondary | ICD-10-CM | POA: Insufficient documentation

## 2022-02-12 DIAGNOSIS — R918 Other nonspecific abnormal finding of lung field: Secondary | ICD-10-CM

## 2022-02-12 DIAGNOSIS — I10 Essential (primary) hypertension: Secondary | ICD-10-CM | POA: Diagnosis not present

## 2022-02-12 DIAGNOSIS — R590 Localized enlarged lymph nodes: Secondary | ICD-10-CM | POA: Diagnosis not present

## 2022-02-12 DIAGNOSIS — K219 Gastro-esophageal reflux disease without esophagitis: Secondary | ICD-10-CM | POA: Diagnosis not present

## 2022-02-12 DIAGNOSIS — Z79899 Other long term (current) drug therapy: Secondary | ICD-10-CM | POA: Insufficient documentation

## 2022-02-12 DIAGNOSIS — F419 Anxiety disorder, unspecified: Secondary | ICD-10-CM | POA: Diagnosis not present

## 2022-02-12 HISTORY — PX: BRONCHIAL BIOPSY: SHX5109

## 2022-02-12 HISTORY — PX: VIDEO BRONCHOSCOPY WITH ENDOBRONCHIAL ULTRASOUND: SHX6177

## 2022-02-12 HISTORY — DX: Personal history of urinary calculi: Z87.442

## 2022-02-12 HISTORY — PX: BRONCHIAL NEEDLE ASPIRATION BIOPSY: SHX5106

## 2022-02-12 HISTORY — PX: VIDEO BRONCHOSCOPY WITH RADIAL ENDOBRONCHIAL ULTRASOUND: SHX6849

## 2022-02-12 HISTORY — PX: BRONCHIAL WASHINGS: SHX5105

## 2022-02-12 HISTORY — PX: BRONCHIAL BRUSHINGS: SHX5108

## 2022-02-12 HISTORY — PX: FINE NEEDLE ASPIRATION: SHX6590

## 2022-02-12 LAB — SARS CORONAVIRUS 2 BY RT PCR: SARS Coronavirus 2 by RT PCR: NEGATIVE

## 2022-02-12 SURGERY — BRONCHOSCOPY, WITH BIOPSY USING ELECTROMAGNETIC NAVIGATION
Anesthesia: General | Laterality: Right

## 2022-02-12 MED ORDER — ONDANSETRON HCL 4 MG/2ML IJ SOLN
INTRAMUSCULAR | Status: DC | PRN
  Administered 2022-02-12: 4 mg via INTRAVENOUS

## 2022-02-12 MED ORDER — FENTANYL CITRATE (PF) 100 MCG/2ML IJ SOLN
INTRAMUSCULAR | Status: DC | PRN
  Administered 2022-02-12 (×2): 50 ug via INTRAVENOUS

## 2022-02-12 MED ORDER — LIDOCAINE 5 % EX PTCH: 1.0000 | MEDICATED_PATCH | Freq: Every day | CUTANEOUS | Status: DC | PRN

## 2022-02-12 MED ORDER — ESMOLOL HCL 100 MG/10ML IV SOLN
INTRAVENOUS | Status: DC | PRN
  Administered 2022-02-12 (×2): 10 mg via INTRAVENOUS

## 2022-02-12 MED ORDER — PHENYLEPHRINE 80 MCG/ML (10ML) SYRINGE FOR IV PUSH (FOR BLOOD PRESSURE SUPPORT)
PREFILLED_SYRINGE | INTRAVENOUS | Status: DC | PRN
  Administered 2022-02-12: 80 ug via INTRAVENOUS
  Administered 2022-02-12: 160 ug via INTRAVENOUS
  Administered 2022-02-12: 120 ug via INTRAVENOUS
  Administered 2022-02-12: 80 ug via INTRAVENOUS
  Administered 2022-02-12: 40 ug via INTRAVENOUS
  Administered 2022-02-12: 80 ug via INTRAVENOUS

## 2022-02-12 MED ORDER — MIDAZOLAM HCL 5 MG/5ML IJ SOLN
INTRAMUSCULAR | Status: DC | PRN
  Administered 2022-02-12: 2 mg via INTRAVENOUS

## 2022-02-12 MED ORDER — DEXAMETHASONE SODIUM PHOSPHATE 4 MG/ML IJ SOLN
INTRAMUSCULAR | Status: DC | PRN
  Administered 2022-02-12: 10 mg via INTRAVENOUS

## 2022-02-12 MED ORDER — SUGAMMADEX SODIUM 200 MG/2ML IV SOLN
INTRAVENOUS | Status: DC | PRN
  Administered 2022-02-12: 200 mg via INTRAVENOUS

## 2022-02-12 MED ORDER — PROPOFOL 10 MG/ML IV BOLUS
INTRAVENOUS | Status: DC | PRN
  Administered 2022-02-12: 30 mg via INTRAVENOUS
  Administered 2022-02-12: 170 mg via INTRAVENOUS

## 2022-02-12 MED ORDER — LACTATED RINGERS IV SOLN
INTRAVENOUS | Status: DC
Start: 2022-02-12 — End: 2022-02-12

## 2022-02-12 MED ORDER — ROCURONIUM BROMIDE 10 MG/ML (PF) SYRINGE
PREFILLED_SYRINGE | INTRAVENOUS | Status: DC | PRN
  Administered 2022-02-12 (×2): 10 mg via INTRAVENOUS
  Administered 2022-02-12: 70 mg via INTRAVENOUS

## 2022-02-12 MED ORDER — CHLORHEXIDINE GLUCONATE 0.12 % MT SOLN
OROMUCOSAL | Status: AC
  Administered 2022-02-12: 15 mL
  Filled 2022-02-12: qty 15

## 2022-02-12 MED ORDER — PROPOFOL 500 MG/50ML IV EMUL
INTRAVENOUS | Status: DC | PRN
  Administered 2022-02-12: 150 ug/kg/min via INTRAVENOUS

## 2022-02-12 MED ORDER — LIDOCAINE 2% (20 MG/ML) 5 ML SYRINGE
INTRAMUSCULAR | Status: DC | PRN
  Administered 2022-02-12: 60 mg via INTRAVENOUS

## 2022-02-12 NOTE — Anesthesia Postprocedure Evaluation (Signed)
Anesthesia Post Note  Patient: Austin Gibbs  Procedure(s) Performed: ROBOTIC ASSISTED NAVIGATIONAL BRONCHOSCOPY (Right) VIDEO BRONCHOSCOPY WITH ENDOBRONCHIAL ULTRASOUND (Right) VIDEO BRONCHOSCOPY WITH RADIAL ENDOBRONCHIAL ULTRASOUND BRONCHIAL NEEDLE ASPIRATION BIOPSIES BRONCHIAL BRUSHINGS BRONCHIAL BIOPSIES BRONCHIAL WASHINGS FINE NEEDLE ASPIRATION     Patient location during evaluation: PACU Anesthesia Type: General Level of consciousness: awake and alert Pain management: pain level controlled Vital Signs Assessment: post-procedure vital signs reviewed and stable Respiratory status: spontaneous breathing, nonlabored ventilation, respiratory function stable and patient connected to nasal cannula oxygen Cardiovascular status: blood pressure returned to baseline and stable Postop Assessment: no apparent nausea or vomiting Anesthetic complications: no   No notable events documented.  Last Vitals:  Vitals:   02/12/22 1415 02/12/22 1430  BP: 131/78 (!) 141/77  Pulse: 81 74  Resp: 20 15  Temp:    SpO2: 94% 94%    Last Pain:  Vitals:   02/12/22 1415  TempSrc:   PainSc: 0-No pain                 Kendalynn Wideman L Ramiya Delahunty

## 2022-02-12 NOTE — Interval H&P Note (Signed)
History and Physical Interval Note:  02/12/2022 9:56 AM  Austin Gibbs  has presented today for surgery, with the diagnosis of RIGHT LOWER LOBE LUNG MASS.  The various methods of treatment have been discussed with the patient and family. After consideration of risks, benefits and other options for treatment, the patient has consented to  Procedure(s): ROBOTIC ASSISTED NAVIGATIONAL BRONCHOSCOPY (Right) VIDEO BRONCHOSCOPY WITH ENDOBRONCHIAL ULTRASOUND (Right) as a surgical intervention.  The patient's history has been reviewed, patient examined, no change in status, stable for surgery.  I have reviewed the patient's chart and labs.  Questions were answered to the patient's satisfaction.     Leslye Peer

## 2022-02-12 NOTE — Transfer of Care (Signed)
Immediate Anesthesia Transfer of Care Note  Patient: Austin Gibbs  Procedure(s) Performed: ROBOTIC ASSISTED NAVIGATIONAL BRONCHOSCOPY (Right) VIDEO BRONCHOSCOPY WITH ENDOBRONCHIAL ULTRASOUND (Right) VIDEO BRONCHOSCOPY WITH RADIAL ENDOBRONCHIAL ULTRASOUND BRONCHIAL NEEDLE ASPIRATION BIOPSIES BRONCHIAL BRUSHINGS BRONCHIAL BIOPSIES BRONCHIAL WASHINGS FINE NEEDLE ASPIRATION  Patient Location: PACU  Anesthesia Type:General  Level of Consciousness: awake  Airway & Oxygen Therapy: Patient Spontanous Breathing and Patient connected to face mask oxygen  Post-op Assessment: Report given to RN and Post -op Vital signs reviewed and stable  Post vital signs: Reviewed and stable  Last Vitals:  Vitals Value Taken Time  BP 140/78 02/12/22 1332  Temp    Pulse 90 02/12/22 1334  Resp 19 02/12/22 1334  SpO2 90 % 02/12/22 1334  Vitals shown include unvalidated device data.  Last Pain:  Vitals:   02/12/22 0942  TempSrc:   PainSc: 0-No pain      Patients Stated Pain Goal: 0 (02/12/22 0942)  Complications: No notable events documented.

## 2022-02-12 NOTE — Anesthesia Procedure Notes (Signed)
Procedure Name: Intubation Date/Time: 02/12/2022 11:38 AM  Performed by: Montez Morita, April W, CRNAPre-anesthesia Checklist: Patient identified, Emergency Drugs available, Suction available and Patient being monitored Patient Re-evaluated:Patient Re-evaluated prior to induction Oxygen Delivery Method: Circle system utilized Preoxygenation: Pre-oxygenation with 100% oxygen Induction Type: IV induction Ventilation: Mask ventilation without difficulty Laryngoscope Size: Miller and 2 Grade View: Grade II Tube type: Oral Tube size: 8.5 mm Number of attempts: 1 Airway Equipment and Method: Stylet and Oral airway Placement Confirmation: ETT inserted through vocal cords under direct vision, positive ETCO2 and breath sounds checked- equal and bilateral Secured at: 23 cm Tube secured with: Tape Dental Injury: Teeth and Oropharynx as per pre-operative assessment

## 2022-02-12 NOTE — Op Note (Signed)
Video Bronchoscopy with Robotic Assisted Bronchoscopic Navigation and Endobronchial Ultrasound Procedure Note  Date of Operation: 02/12/2022   Pre-op Diagnosis: Right lower lobe mass, mediastinal adenopathy   Post-op Diagnosis: Same  Surgeon: Levy Pupa  Assistants: None  Anesthesia: General endotracheal anesthesia  Operation: Flexible video fiberoptic bronchoscopy with robotic assistance and biopsies.  Estimated Blood Loss: Minimal  Complications: None  Indications and History: Austin Gibbs is a 66 y.o. male with history of Hyperlipidemia, GERD.  He was seen for chest and flank discomfort.  CT scan revealed a rounded right lower lobe opacity, question mass and some mediastinal adenopathy.  Recommendation made to achieve a tissue diagnosis via robotic assisted navigational bronchoscopy and endobronchial ultrasound with biopsies. The risks, benefits, complications, treatment options and expected outcomes were discussed with the patient.  The possibilities of pneumothorax, pneumonia, reaction to medication, pulmonary aspiration, perforation of a viscus, bleeding, failure to diagnose a condition and creating a complication requiring transfusion or operation were discussed with the patient who freely signed the consent.    Description of Procedure: The patient was seen in the Preoperative Area, was examined and was deemed appropriate to proceed.  The patient was taken to [facility] endoscopy room 3, identified as Austin Gibbs and the procedure verified as Flexible Video Fiberoptic Bronchoscopy.  A Time Out was held and the above information confirmed.   Prior to the date of the procedure a high-resolution CT scan of the chest was performed. Utilizing ION software program a virtual tracheobronchial tree was generated to allow the creation of distinct navigation pathways to the patient's parenchymal abnormalities. After being taken to the operating room general anesthesia was initiated and the  patient  was orally intubated. The video fiberoptic bronchoscope was introduced via the endotracheal tube and a general inspection was performed which showed normal right and left lung anatomy. Aspiration of the bilateral mainstems was completed to remove any remaining secretions. Robotic catheter inserted into patient's endotracheal tube.   Target #1 right lower lobe mass: The distinct navigation pathways prepared prior to this procedure were then utilized to navigate to patient's lesion identified on CT scan. The robotic catheter was secured into place and the vision probe was withdrawn.  Lesion location was approximated using fluoroscopy and radial endobronchial ultrasound for peripheral targeting.  Local registration and targeting was performed using Cios three-dimensional imaging.  Under fluoroscopic guidance transbronchial needle brushings, transbronchial needle biopsies, and transbronchial forceps biopsies were performed to be sent for cytology and pathology. A bronchioalveolar lavage was performed in the right lower lobe and sent for cytology.  The standard scope was then withdrawn and the endobronchial ultrasound was used to identify and characterize the peritracheal, hilar and bronchial lymph nodes. Inspection showed small 4L lymph node, slightly enlarged 4R lymph node.  No other nodes were found. Using real-time ultrasound guidance Wang needle biopsies were take from Station 4R node and were sent for cytology.   At the end of the procedure a general airway inspection was performed and there was no evidence of active bleeding. The bronchoscope was removed.  The patient tolerated the procedure well. There was no significant blood loss and there were no obvious complications. A post-procedural chest x-ray is pending.  Samples Target #1: 1. Transbronchial needle brushings from right lower lobe mass 2. Transbronchial Wang needle biopsies from right lower lobe mass 3. Transbronchial forceps biopsies  from right lower lobe mass  Samples: 1. Wang needle biopsies from 4R node  Plans:  The patient will be discharged  from the PACU to home when recovered from anesthesia and after chest x-ray is reviewed. We will review the cytology, pathology and microbiology results with the patient when they become available. Outpatient followup will be with Dr. Delton Coombes.   Levy Pupa, MD, PhD 02/12/2022, 1:31 PM  Pulmonary and Critical Care [phone] or if no answer before 7:00PM call [phone] For any issues after 7:00PM please call eLink [phone]

## 2022-02-12 NOTE — Discharge Instructions (Signed)
Flexible Bronchoscopy, Care After This sheet gives you information about how to care for yourself after your test. Your doctor may also give you more specific instructions. If you have problems or questions, contact your doctor. Follow these instructions at home: Eating and drinking When your numbness is gone and your cough and gag reflexes have come back, you may: Eat only soft foods. Slowly drink liquids. The day after the test, go back to your normal diet. Driving Do not drive for 24 hours if you were given a medicine to help you relax (sedative). Do not drive or use heavy machinery while taking prescription pain medicine. General instructions  Take over-the-counter and prescription medicines only as told by your doctor. Return to your normal activities as told. Ask what activities are safe for you. Do not use any products that have nicotine or tobacco in them. This includes cigarettes and e-cigarettes. If you need help quitting, ask your doctor. Keep all follow-up visits as told by your doctor. This is important. It is very important if you had a tissue sample (biopsy) taken. Get help right away if: You have shortness of breath that gets worse. You get light-headed. You feel like you are going to pass out (faint). You have chest pain. You cough up: More than a little blood. More blood than before. Summary Do not eat or drink anything (not even water) for 2 hours after your test, or until your numbing medicine wears off. Do not use cigarettes. Do not use e-cigarettes. Get help right away if you have chest pain.  Please call our office for any questions or concerns.  [phone].  This information is not intended to replace advice given to you by your health care provider. Make sure you discuss any questions you have with your health care provider. Document Released: 03/18/2009 Document Revised: 05/03/2017 Document Reviewed: 06/08/2016 Elsevier Patient Education  2020 Elsevier  Inc.  

## 2022-02-13 LAB — ACID FAST SMEAR (AFB, MYCOBACTERIA): Acid Fast Smear: NEGATIVE

## 2022-02-14 LAB — CYTOLOGY - NON PAP

## 2022-02-14 LAB — CULTURE, BAL-QUANTITATIVE W GRAM STAIN
Culture: NO GROWTH
Gram Stain: NONE SEEN

## 2022-02-15 ENCOUNTER — Telehealth: Payer: Self-pay

## 2022-02-15 NOTE — Telephone Encounter (Signed)
Spoke with pt and reviewed bronchoscopy results as dictated by Dr. Delton Coombes. Pt stated understandment. Nothing further needed at this time.

## 2022-02-16 ENCOUNTER — Other Ambulatory Visit (HOSPITAL_COMMUNITY): Payer: Commercial Managed Care - HMO

## 2022-02-17 LAB — AEROBIC/ANAEROBIC CULTURE W GRAM STAIN (SURGICAL/DEEP WOUND)
Culture: NO GROWTH
Gram Stain: NONE SEEN

## 2022-02-23 ENCOUNTER — Other Ambulatory Visit: Payer: Self-pay | Admitting: *Deleted

## 2022-02-23 DIAGNOSIS — K219 Gastro-esophageal reflux disease without esophagitis: Secondary | ICD-10-CM

## 2022-02-23 MED ORDER — ATORVASTATIN CALCIUM 20 MG PO TABS: 20.0000 mg | ORAL_TABLET | Freq: Every day | ORAL | 0 refills | Status: AC

## 2022-02-23 MED ORDER — LOSARTAN POTASSIUM 100 MG PO TABS: 100.0000 mg | ORAL_TABLET | Freq: Every day | ORAL | 0 refills | Status: AC

## 2022-02-23 MED ORDER — PANTOPRAZOLE SODIUM 40 MG PO TBEC: DELAYED_RELEASE_TABLET | ORAL | 0 refills | Status: AC

## 2022-02-23 MED ORDER — AMLODIPINE BESYLATE 5 MG PO TABS: 5.0000 mg | ORAL_TABLET | Freq: Every day | ORAL | 0 refills | Status: AC

## 2022-02-23 NOTE — Telephone Encounter (Signed)
Patient called and stated that he is out in [facility] with his mom. Stated that he had to place her in the hospital and had to stay longer than expected and has ran out of his medications.   Requesting refills on them to be sent to [place].   Rx e-scribed as requested.

## 2022-03-06 LAB — FUNGUS CULTURE WITH STAIN

## 2022-03-20 DIAGNOSIS — I10 Essential (primary) hypertension: Secondary | ICD-10-CM | POA: Diagnosis not present

## 2022-03-20 DIAGNOSIS — R7303 Prediabetes: Secondary | ICD-10-CM | POA: Diagnosis not present

## 2022-03-20 DIAGNOSIS — Z23 Encounter for immunization: Secondary | ICD-10-CM | POA: Diagnosis not present

## 2022-03-20 DIAGNOSIS — K219 Gastro-esophageal reflux disease without esophagitis: Secondary | ICD-10-CM | POA: Diagnosis not present

## 2022-03-20 DIAGNOSIS — E782 Mixed hyperlipidemia: Secondary | ICD-10-CM | POA: Diagnosis not present

## 2022-03-21 ENCOUNTER — Telehealth: Payer: Self-pay | Admitting: Emergency Medicine

## 2022-03-21 ENCOUNTER — Ambulatory Visit: Payer: Medicare Other | Admitting: Emergency Medicine

## 2022-03-21 ENCOUNTER — Encounter: Payer: Self-pay | Admitting: Emergency Medicine

## 2022-03-21 VITALS — BP 122/72 | HR 69 | Ht 65.0 in | Wt 174.6 lb

## 2022-03-21 DIAGNOSIS — R918 Other nonspecific abnormal finding of lung field: Secondary | ICD-10-CM | POA: Diagnosis not present

## 2022-03-21 DIAGNOSIS — J189 Pneumonia, unspecified organism: Secondary | ICD-10-CM | POA: Diagnosis not present

## 2022-03-21 NOTE — Assessment & Plan Note (Signed)
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  The 4R lymph node was negative/normal.  Suspect that this was a primary bacterial infection consider other inflammatory processes.  Discussed the differential diagnoses with him today.  We will repeat his CT scan of the chest without contrast to look for resolution, persistence.  He will need serial follow-up unless it resolves completely.  If the infiltrate persists then we can consider autoimmune work-up, repeat biopsy, etc.

## 2022-03-21 NOTE — Telephone Encounter (Signed)
Called pt in attempt to inform him about CT appt scheduled for next Wednesday at [facility] 1:30pm. Left the pt a vm with the appt details If he returns my call. Thanks

## 2022-03-21 NOTE — Progress Notes (Signed)
Subjective:    Patient ID: Austin Gibbs, male    DOB: February 15, 1956, 66 y.o.   MRN: [id number]  HPI 66 year old never smoker with history of hyperlipidemia, hypertension, restless leg syndrome, GERD.  He was evaluated in the ED on 02/05/2022 for sudden onset of severe right-sided back pain near his scapula that radiated to the front of his chest.  His lab work was reassuring, right upper quadrant ultrasound reassuring.  He underwent a CT chest abdomen pelvis as below.  He is referred for newly identified right lower lobe masslike opacity. Feeling well. No fevers, sputum. He does have some cough and has experienced some R flank pain and back pain with movement and sometimes cough, deep breath.   CT angio chest/abdomen/pelvis 02/05/2022 reviewed by me shows no evidence of pulmonary embolism, there was some lymphadenopathy noted along the distal esophagus near the GE junction largest 7 mm with some mild circumferential distal esophageal wall thickening.  There was a 3.6 x 2.7 medial right lower lobe paraspinal masslike opacity.  Also noted was an enlarged right hilar node at station 4R  ROV 03/21/22 --follow-up visit 66 year old never smoker for evaluation of right lower lobe opacity, right hilar adenopathy.  He underwent bronchoscopy on 02/12/2022.  His right lower lobe biopsies showed macrophages and changes patible with organizing pneumonia without any evidence of malignancy.  4R lymph node biopsies were benign.  AFB and fungal smears are negative as is bacterial culture   Review of Systems As per HPI  Past Medical History:  Diagnosis Date   Erectile dysfunction    GERD (gastroesophageal reflux disease)    History of kidney stones    Hypercholesteremia    Hypertension    Insomnia    Neuromuscular disorder (HCC)    Restless leg syndrome    Phobic anxiety disorder    fear of driving over bridges   Restless leg syndrome      Family History  Problem Relation Age of Onset   COPD Father 27    Hyperlipidemia Mother 37   Heart attack Maternal Grandfather        Died in 10s from heart attack   Heart attack Paternal Grandfather        Died in 58s from heart attack     Social History   Socioeconomic History   Marital status: Married    Spouse name: Not on file   Number of children: Not on file   Years of education: Not on file   Highest education level: Not on file  Occupational History   Not on file  Tobacco Use   Smoking status: Never    Passive exposure: Past   Smokeless tobacco: Never  Vaping Use   Vaping Use: Never used  Substance and Sexual Activity   Alcohol use: Yes    Comment: Occasional margarita on weekends.   Drug use: No   Sexual activity: Not Currently  Other Topics Concern   Not on file  Social History Narrative   Diet: Blank      Do you drink/ eat things with caffeine? Coffee      Marital status:   Married                            What year were you married ? 2001      Do you live in a house, apartment,assistred living, condo, trailer, etc.)? House      Is it one or  more stories? One      How many persons live in your home ? 3      Do you have any pets in your home ?(please list) Dog      Highest Level of education completed:2 yrs. College      Current or past profession:  [employer]      Do you exercise?  Nope                            Type & how often Blank      ADVANCED DIRECTIVES (Please bring copies)      Do you have a living will? No      Do you have a DNR form?    No                   If not, do you want to discuss one? Yes      Do you have signed POA?HPOA forms?   No              If so, please bring to your appointment      FUNCTIONAL STATUS- To be completed by Spouse / child / Staff       Do you have difficulty bathing or dressing yourself ?  No      Do you have difficulty preparing food or eating ?  No      Do you have difficulty managing your mediation ? No      Do you have difficulty managing your finances ?  No       Do you have difficulty affording your medication ?  No      Blank   Social Determinants of Corporate investment banker Strain: Not on file  Food Insecurity: Not on file  Transportation Needs: Not on file  Physical Activity: Not on file  Stress: Not on file  Social Connections: Not on file  Intimate Partner Violence: Not on file     No Known Allergies   Outpatient Medications Prior to Visit  Medication Sig Dispense Refill   amLODipine (NORVASC) 5 MG tablet Take 1 tablet (5 mg total) by mouth daily. 30 tablet 0   atorvastatin (LIPITOR) 20 MG tablet Take 1 tablet (20 mg total) by mouth daily. 30 tablet 0   losartan (COZAAR) 100 MG tablet Take 1 tablet (100 mg total) by mouth daily. 30 tablet 0   pantoprazole (PROTONIX) 40 MG tablet Take one tablet by mouth once daily. 30 tablet 0   sildenafil (REVATIO) 20 MG tablet TAKE ONE TABLET BY MOUTH DAILY ONE HOUR PRIOR 10 tablet 1   lidocaine (LIDODERM) 5 % Place 1 patch onto the skin daily as needed (back pain). Remove & Discard patch within 12 hours or as directed by MD (Patient not taking: Reported on 03/21/2022)     Magnesium Bisglycinate (MAG GLYCINATE PO) Take 400 mg by mouth in the morning and at bedtime.     oxyCODONE (ROXICODONE) 5 MG immediate release tablet Take 1 tablet (5 mg total) by mouth every 4 (four) hours as needed for severe pain. (Patient not taking: Reported on 02/08/2022) 30 tablet 0   No facility-administered medications prior to visit.         Objective:   Physical Exam Vitals:   03/21/22 0842  BP: 122/72  Pulse: 69  SpO2: 99%  Weight: 174 lb 9.6 oz (79.2 kg)  Height: 5\' 5"  (1.651 m)  Gen: Pleasant, well-nourished, in no distress,  normal affect  ENT: No lesions,  mouth clear,  oropharynx clear, no postnasal drip  Neck: No JVD, no stridor  Lungs: No use of accessory muscles, no crackles or wheezing on normal respiration, no wheeze on forced expiration  Cardiovascular: RRR, heart sounds normal, no  murmur or gallops, no peripheral edema  Musculoskeletal: No deformities, no cyanosis or clubbing  Neuro: alert, awake, non focal  Skin: Warm, no lesions or rash      Assessment & Plan:  Right lower lobe lung mass 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  The 4R lymph node was negative/normal.  Suspect that this was a primary bacterial infection consider other inflammatory processes.  Discussed the differential diagnoses with him today.  We will repeat his CT scan of the chest without contrast to look for resolution, persistence.  He will need serial follow-up unless it resolves completely.  If the infiltrate persists then we can consider autoimmune work-up, repeat biopsy, etc.   Baltazar Apo, MD, PhD 03/21/2022, 9:09 AM [facility] [phone] or if no answer before 7:00PM call [phone] For any issues after 7:00PM please call eLink [phone]

## 2022-03-21 NOTE — Patient Instructions (Signed)
We reviewed your bronchoscopy results today. We will plan to repeat your CT scan of the chest Follow Dr. Lamonte Sakai next available after your CT scan so we can review the results together.

## 2022-03-28 ENCOUNTER — Other Ambulatory Visit (HOSPITAL_COMMUNITY): Payer: Medicare Other

## 2022-03-28 LAB — ACID FAST CULTURE WITH REFLEXED SENSITIVITIES (MYCOBACTERIA): Acid Fast Culture: NEGATIVE

## 2022-04-04 ENCOUNTER — Ambulatory Visit (HOSPITAL_COMMUNITY)
Admission: RE | Admit: 2022-04-04 | Discharge: 2022-04-04 | Disposition: A | Discharge status: Home / Self Care | Payer: Medicare Other | Source: Ambulatory Visit | Attending: Emergency Medicine | Admitting: Emergency Medicine

## 2022-04-04 ENCOUNTER — Other Ambulatory Visit (HOSPITAL_COMMUNITY): Payer: Medicare Other

## 2022-04-04 DIAGNOSIS — J479 Bronchiectasis, uncomplicated: Secondary | ICD-10-CM | POA: Diagnosis not present

## 2022-04-04 DIAGNOSIS — J189 Pneumonia, unspecified organism: Secondary | ICD-10-CM | POA: Insufficient documentation

## 2022-04-04 DIAGNOSIS — J852 Abscess of lung without pneumonia: Secondary | ICD-10-CM | POA: Diagnosis not present

## 2022-05-22 ENCOUNTER — Encounter: Payer: Self-pay | Admitting: Emergency Medicine

## 2022-05-22 ENCOUNTER — Ambulatory Visit: Payer: Medicare Other | Admitting: Emergency Medicine

## 2022-05-22 VITALS — BP 134/68 | HR 78 | Temp 98.2°F | Ht 65.0 in | Wt 176.0 lb

## 2022-05-22 DIAGNOSIS — R918 Other nonspecific abnormal finding of lung field: Secondary | ICD-10-CM

## 2022-05-22 NOTE — Assessment & Plan Note (Signed)
Fully resolved on serial imaging.  His transbronchial biopsies were negative for malignancy, showed some evidence of organizing pneumonia.  Cause unclear.  He does not have any history of autoimmune disease.  I do not think he needs any repeat CT scan unless there is an interval change in his respiratory status.  Reviewed this with him today.  We will plan to follow-up, consider repeat chest x-ray or CT chest if he has new pulmonary symptoms.

## 2022-05-22 NOTE — Progress Notes (Signed)
Subjective:    Patient ID: Austin Gibbs, male    DOB: Mar 20, 1956, 66 y.o.   MRN: [id number]  HPI 66 year old never smoker with history of hyperlipidemia, hypertension, restless leg syndrome, GERD.  He was evaluated in the ED on 02/05/2022 for sudden onset of severe right-sided back pain near his scapula that radiated to the front of his chest.  His lab work was reassuring, right upper quadrant ultrasound reassuring.  He underwent a CT chest abdomen pelvis as below.  He is referred for newly identified right lower lobe masslike opacity. Feeling well. No fevers, sputum. He does have some cough and has experienced some R flank pain and back pain with movement and sometimes cough, deep breath.   CT angio chest/abdomen/pelvis 02/05/2022 reviewed by me shows no evidence of pulmonary embolism, there was some lymphadenopathy noted along the distal esophagus near the GE junction largest 7 mm with some mild circumferential distal esophageal wall thickening.  There was a 3.6 x 2.7 medial right lower lobe paraspinal masslike opacity.  Also noted was an enlarged right hilar node at station 4R  ROV 03/21/22 --follow-up visit 66 year old never smoker for evaluation of right lower lobe opacity, right hilar adenopathy.  He underwent bronchoscopy on 02/12/2022.  His right lower lobe biopsies showed macrophages and changes patible with organizing pneumonia without any evidence of malignancy.  4R lymph node biopsies were benign.  AFB and fungal smears are negative as is bacterial culture   ROV 05/22/22 --Austin Gibbs is 66, never smoker whom I have followed for right hilar adenopathy and a right lower lobe opacity.  Bronchoscopy 02/12/2022 showed suspected organizing pneumonia without any evidence of malignancy, has 4R lymph node biopsies were benign.  His cultures are all negative.  We repeated his CT chest 04/04/2022 as below.  He reports today  CT scan of the chest 04/04/2022 reviewed by me, shows minimal residual  medial right lower lobe volume loss and bronchiectasis, much improved compared with 02/2022.  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   Restless leg syndrome      Family History  Problem Relation Age of Onset   COPD Father 76   Hyperlipidemia Mother 78   Heart attack Maternal Grandfather        Died in 36s from heart attack   Heart attack Paternal Grandfather        Died in 29s from heart attack     Social History   Socioeconomic History   Marital status: Married    Spouse name: Not on file   Number of children: Not on file   Years of education: Not on file   Highest education level: Not on file  Occupational History   Not on file  Tobacco Use   Smoking status: Never    Passive exposure: Past   Smokeless tobacco: Never  Vaping Use   Vaping Use: Never used  Substance and Sexual Activity   Alcohol use: Yes    Comment: Occasional margarita on weekends.   Drug use: No   Sexual activity: Not Currently  Other Topics  Concern   Not on file  Social History Narrative   Diet: Blank      Do you drink/ eat things with caffeine? Coffee      Marital status:   Married                            What year were you married ? 2001      Do you live in a house, apartment,assistred living, condo, trailer, etc.)? House      Is it one or more stories? One      How many persons live in your home ? 3      Do you have any pets in your home ?(please list) Dog      Highest Level of education completed:2 yrs. College      Current or past profession:  [employer]      Do you exercise?  Nope                            Type & how often Blank       ADVANCED DIRECTIVES (Please bring copies)      Do you have a living will? No      Do you have a DNR form?    No                   If not, do you want to discuss one? Yes      Do you have signed POA?HPOA forms?   No              If so, please bring to your appointment      FUNCTIONAL STATUS- To be completed by Spouse / child / Staff       Do you have difficulty bathing or dressing yourself ?  No      Do you have difficulty preparing food or eating ?  No      Do you have difficulty managing your mediation ? No      Do you have difficulty managing your finances ?  No      Do you have difficulty affording your medication ?  No      Blank   Social Determinants of Corporate investment banker Strain: Not on file  Food Insecurity: Not on file  Transportation Needs: Not on file  Physical Activity: Not on file  Stress: Not on file  Social Connections: Not on file  Intimate Partner Violence: Not on file     No Known Allergies   Outpatient Medications Prior to Visit  Medication Sig Dispense Refill   amLODipine (NORVASC) 5 MG tablet Take 1 tablet (5 mg total) by mouth daily. 30 tablet 0   atorvastatin (LIPITOR) 20 MG tablet Take 1 tablet (20 mg total) by mouth daily. 30 tablet 0   losartan (COZAAR) 100 MG tablet Take 1 tablet (100 mg total) by mouth daily. 30 tablet 0   pantoprazole (PROTONIX) 40 MG tablet Take one tablet by mouth once daily. 30 tablet 0   sildenafil (REVATIO) 20 MG tablet TAKE ONE TABLET BY MOUTH DAILY ONE HOUR PRIOR 10 tablet 1   No facility-administered medications prior to visit.         Objective:   Physical Exam Vitals:   05/22/22 0916  BP: 134/68  Pulse: 78  Temp: 98.2 F (36.8 C)  TempSrc: Oral  SpO2: 100%  Weight: 176 lb (79.8 kg)  Height: 5\' 5"  (1.651 m)   Gen: Pleasant, well-nourished, in no distress,  normal affect  ENT: No lesions,  mouth clear,  oropharynx clear, no postnasal drip  Neck: No JVD, no stridor  Lungs: No use of  accessory muscles, no crackles or wheezing on normal respiration, no wheeze on forced expiration  Cardiovascular: RRR, heart sounds normal, no murmur or gallops, no peripheral edema  Musculoskeletal: No deformities, no cyanosis or clubbing  Neuro: alert, awake, non focal  Skin: Warm, no lesions or rash      Assessment & Plan:  Right lower lobe lung mass Fully resolved on serial imaging.  His transbronchial biopsies were negative for malignancy, showed some evidence of organizing pneumonia.  Cause unclear.  He does not have any history of autoimmune disease.  I do not think he needs any repeat CT scan unless there is an interval change in his respiratory status.  Reviewed this with him today.  We will plan to follow-up, consider repeat chest x-ray or CT chest if he has new pulmonary symptoms.    , MD, PhD 05/22/2022, 9:27 AM [facility] [phone] or if no answer before 7:00PM call [phone] For any issues after 7:00PM please call eLink [phone]

## 2022-05-22 NOTE — Patient Instructions (Signed)
We reviewed your CT scan of the chest today.  This is significantly improved compared with 3 months ago.  Good news. We should not need to repeat any more CT scans of your chest unless you have a change in your breathing or how you are feeling. Please call our office if you develop any new respiratory symptoms so we can set up an office visit to discuss.

## 2022-05-23 ENCOUNTER — Encounter: Payer: Medicare Other | Admitting: Family

## 2022-05-23 NOTE — Progress Notes (Signed)
  This encounter was created in error - please disregard. No show 

## 2022-08-03 DIAGNOSIS — R634 Abnormal weight loss: Secondary | ICD-10-CM | POA: Diagnosis not present

## 2022-08-03 DIAGNOSIS — K529 Noninfective gastroenteritis and colitis, unspecified: Secondary | ICD-10-CM | POA: Diagnosis not present

## 2022-08-06 DIAGNOSIS — K5900 Constipation, unspecified: Secondary | ICD-10-CM | POA: Diagnosis not present

## 2022-08-06 DIAGNOSIS — R142 Eructation: Secondary | ICD-10-CM | POA: Diagnosis not present

## 2022-08-06 DIAGNOSIS — Z9189 Other specified personal risk factors, not elsewhere classified: Secondary | ICD-10-CM | POA: Diagnosis not present

## 2022-08-06 DIAGNOSIS — I10 Essential (primary) hypertension: Secondary | ICD-10-CM | POA: Diagnosis not present

## 2022-08-06 DIAGNOSIS — Z634 Disappearance and death of family member: Secondary | ICD-10-CM | POA: Diagnosis not present

## 2022-09-20 DIAGNOSIS — E782 Mixed hyperlipidemia: Secondary | ICD-10-CM | POA: Diagnosis not present

## 2022-09-20 DIAGNOSIS — I10 Essential (primary) hypertension: Secondary | ICD-10-CM | POA: Diagnosis not present

## 2022-09-20 DIAGNOSIS — K219 Gastro-esophageal reflux disease without esophagitis: Secondary | ICD-10-CM | POA: Diagnosis not present

## 2022-09-20 DIAGNOSIS — R7303 Prediabetes: Secondary | ICD-10-CM | POA: Diagnosis not present

## 2022-10-05 ENCOUNTER — Other Ambulatory Visit: Payer: Self-pay | Admitting: Gastroenterology

## 2022-10-05 DIAGNOSIS — K5909 Other constipation: Secondary | ICD-10-CM | POA: Diagnosis not present

## 2022-10-05 DIAGNOSIS — R1319 Other dysphagia: Secondary | ICD-10-CM | POA: Diagnosis not present

## 2022-10-05 DIAGNOSIS — K219 Gastro-esophageal reflux disease without esophagitis: Secondary | ICD-10-CM | POA: Diagnosis not present

## 2022-12-19 ENCOUNTER — Ambulatory Visit (HOSPITAL_COMMUNITY)
Admission: RE | Admit: 2022-12-19 | Discharge: 2022-12-19 | Disposition: A | Discharge status: Home / Self Care | Payer: Medicare Other | Attending: Gastroenterology | Admitting: Gastroenterology

## 2022-12-19 ENCOUNTER — Encounter (HOSPITAL_COMMUNITY): Payer: Self-pay | Admitting: Gastroenterology

## 2022-12-19 ENCOUNTER — Encounter (HOSPITAL_COMMUNITY)
Admission: RE | Disposition: A | Discharge status: Home / Self Care | Payer: Self-pay | Source: Home / Self Care | Attending: Gastroenterology

## 2022-12-19 DIAGNOSIS — K219 Gastro-esophageal reflux disease without esophagitis: Secondary | ICD-10-CM | POA: Diagnosis not present

## 2022-12-19 DIAGNOSIS — R1319 Other dysphagia: Secondary | ICD-10-CM | POA: Diagnosis not present

## 2022-12-19 HISTORY — PX: ESOPHAGEAL MANOMETRY: SHX5429

## 2022-12-19 SURGERY — MANOMETRY, ESOPHAGUS

## 2022-12-19 MED ORDER — LIDOCAINE VISCOUS HCL 2 % MT SOLN
OROMUCOSAL | Status: AC
  Filled 2022-12-19: qty 15

## 2022-12-19 SURGICAL SUPPLY — 2 items
FACESHIELD LNG OPTICON STERILE (SAFETY) IMPLANT
GLOVE BIO SURGEON STRL SZ8 (GLOVE) ×2 IMPLANT

## 2022-12-19 NOTE — Progress Notes (Signed)
Esophageal manometry performed per protocol.  Patient tolerated well.

## 2022-12-29 IMAGING — DX DG SHOULDER 2+V*L*
3 series · 3 of 3 positions shown · non-contrast
Comparison: None Available.

CLINICAL DATA: Left shoulder pain for 1 month, no known injury,
initial encounter

EXAM:
LEFT SHOULDER - 2+ VIEW

[shoulder grashey]
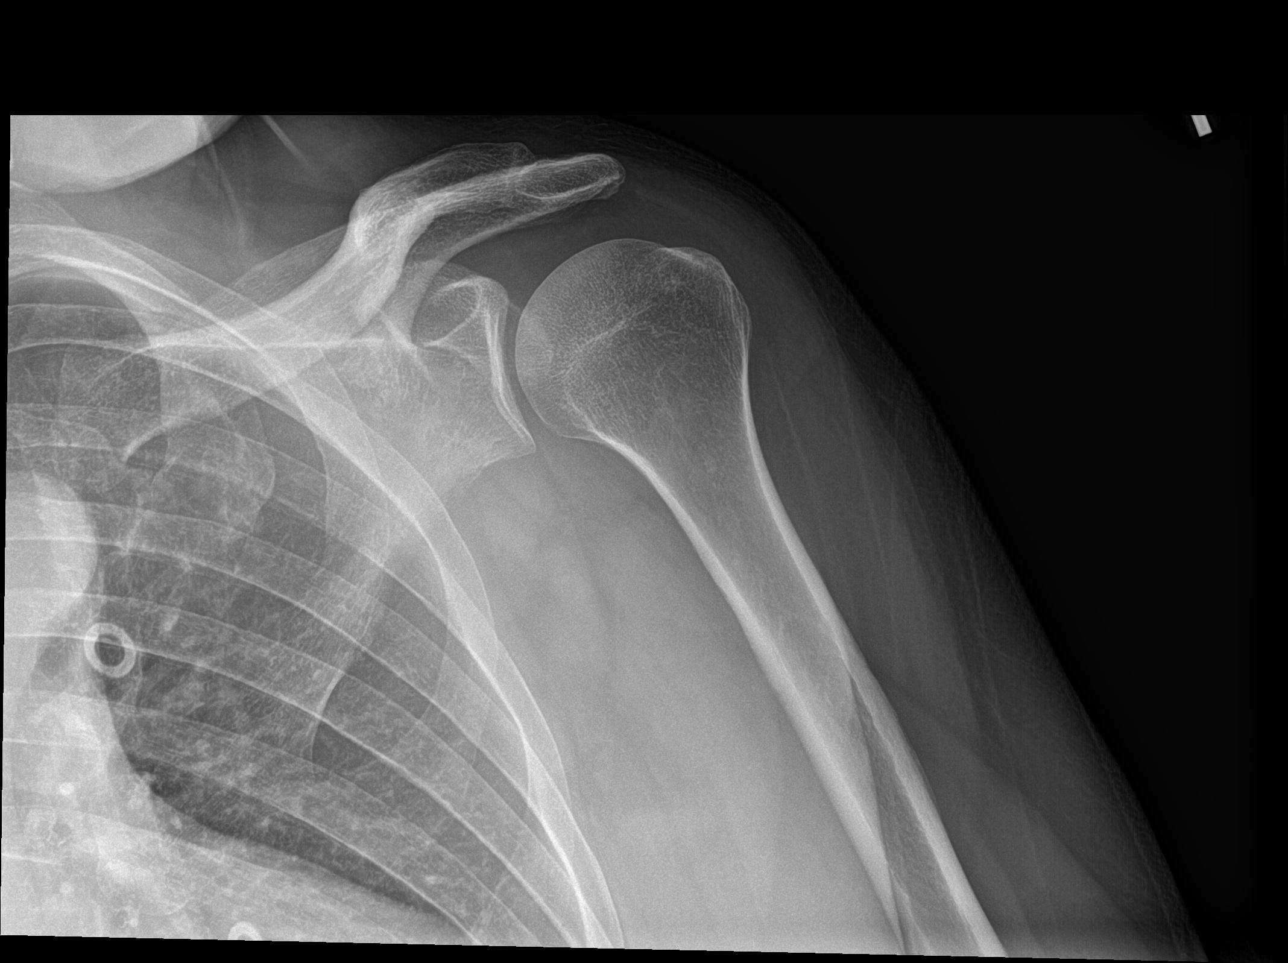

[shoulder axillary]
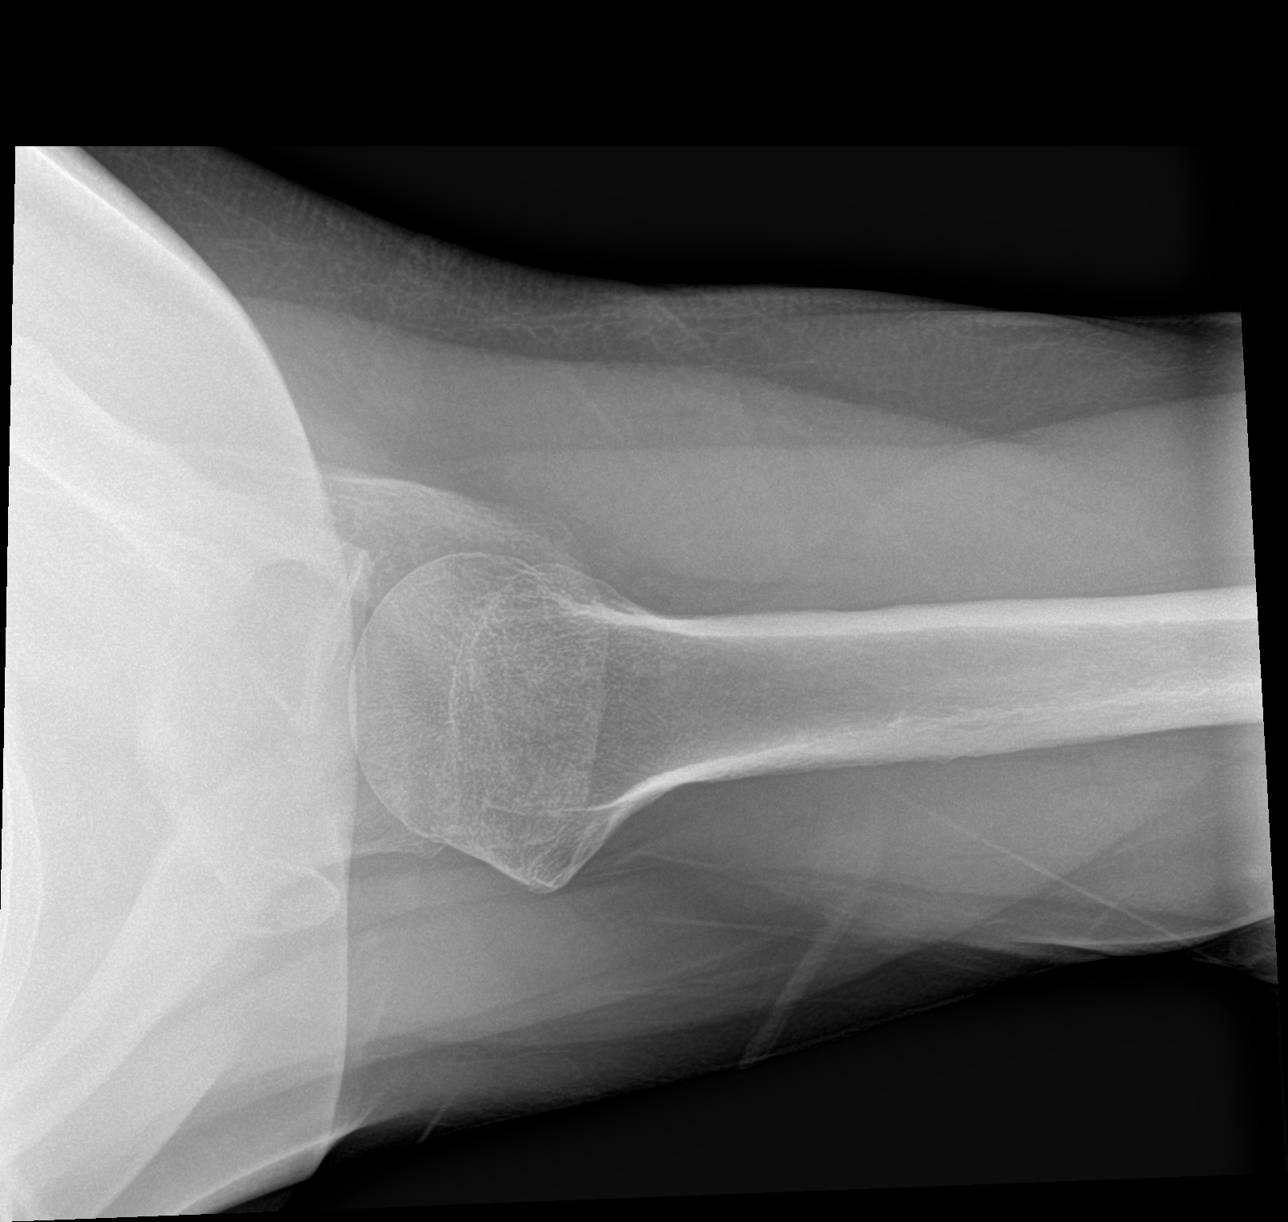

[shoulder y view]
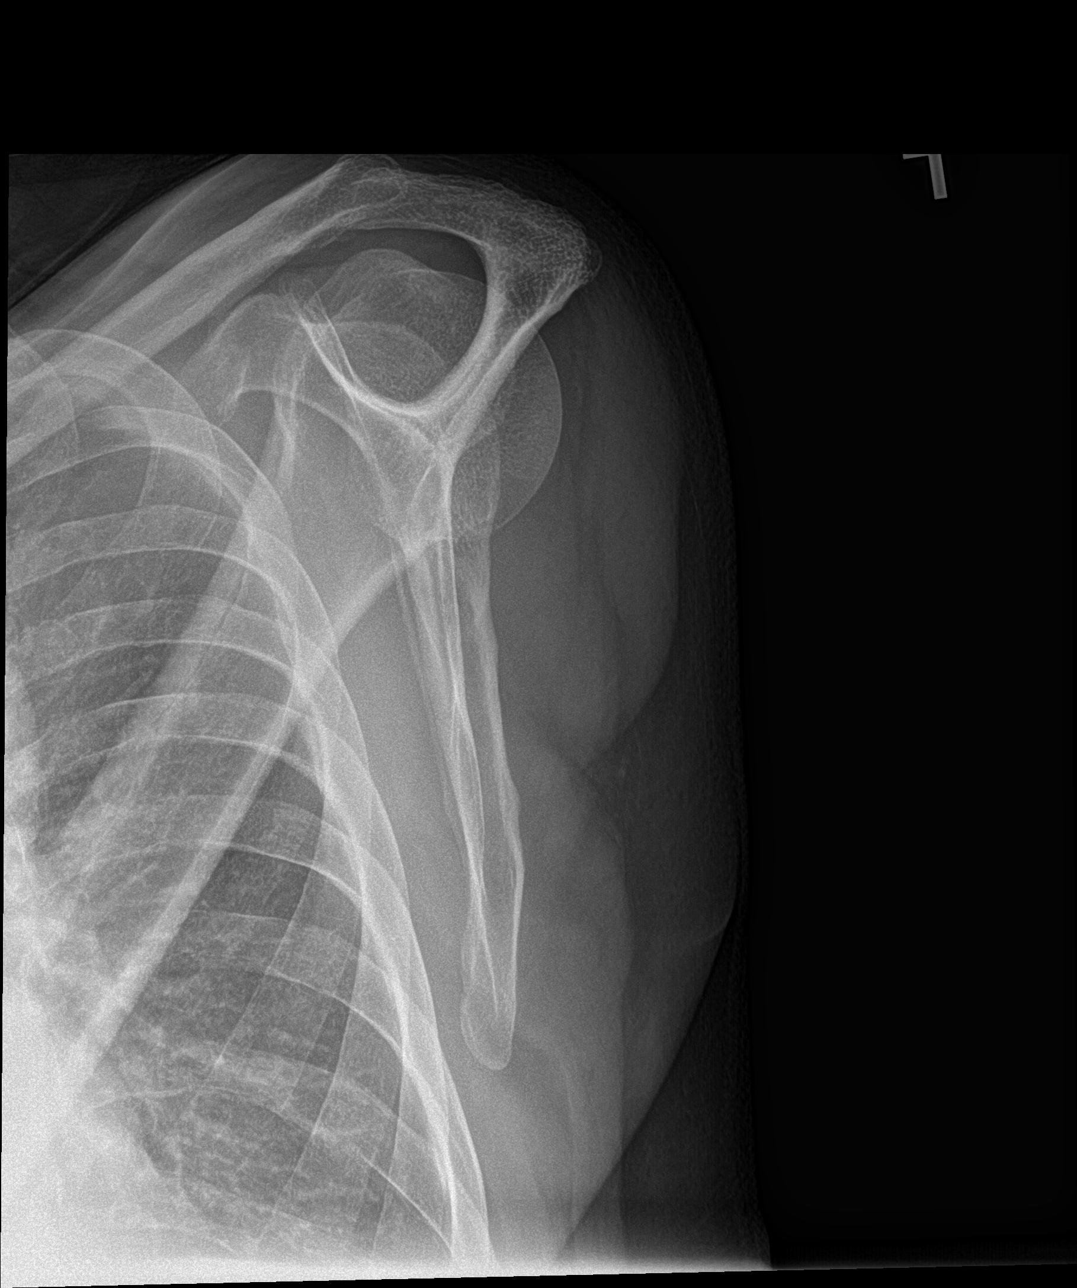

[3 of 3 positions shown; findings below may reference images not displayed]

FINDINGS: There is no evidence of fracture or dislocation. There is no
evidence of arthropathy or other focal bone abnormality. Soft
tissues are unremarkable.
IMPRESSION: No acute abnormality noted.

## 2023-05-20 DIAGNOSIS — E782 Mixed hyperlipidemia: Secondary | ICD-10-CM | POA: Diagnosis not present

## 2023-05-20 DIAGNOSIS — D179 Benign lipomatous neoplasm, unspecified: Secondary | ICD-10-CM | POA: Diagnosis not present

## 2023-05-20 DIAGNOSIS — Z23 Encounter for immunization: Secondary | ICD-10-CM | POA: Diagnosis not present

## 2023-05-20 DIAGNOSIS — Z0000 Encounter for general adult medical examination without abnormal findings: Secondary | ICD-10-CM | POA: Diagnosis not present

## 2023-05-20 DIAGNOSIS — R7303 Prediabetes: Secondary | ICD-10-CM | POA: Diagnosis not present

## 2023-05-20 DIAGNOSIS — B356 Tinea cruris: Secondary | ICD-10-CM | POA: Diagnosis not present

## 2023-05-20 DIAGNOSIS — I10 Essential (primary) hypertension: Secondary | ICD-10-CM | POA: Diagnosis not present

## 2023-05-20 DIAGNOSIS — K219 Gastro-esophageal reflux disease without esophagitis: Secondary | ICD-10-CM | POA: Diagnosis not present

## 2023-06-07 DIAGNOSIS — H00024 Hordeolum internum left upper eyelid: Secondary | ICD-10-CM | POA: Diagnosis not present

## 2023-11-18 DIAGNOSIS — R053 Chronic cough: Secondary | ICD-10-CM | POA: Diagnosis not present

## 2023-11-18 DIAGNOSIS — R0981 Nasal congestion: Secondary | ICD-10-CM | POA: Diagnosis not present

## 2023-11-18 DIAGNOSIS — R7303 Prediabetes: Secondary | ICD-10-CM | POA: Diagnosis not present

## 2023-11-18 DIAGNOSIS — I10 Essential (primary) hypertension: Secondary | ICD-10-CM | POA: Diagnosis not present

## 2023-11-18 DIAGNOSIS — E782 Mixed hyperlipidemia: Secondary | ICD-10-CM | POA: Diagnosis not present

## 2023-11-18 DIAGNOSIS — B349 Viral infection, unspecified: Secondary | ICD-10-CM | POA: Diagnosis not present

## 2023-11-18 DIAGNOSIS — K219 Gastro-esophageal reflux disease without esophagitis: Secondary | ICD-10-CM | POA: Diagnosis not present

## 2023-12-04 DIAGNOSIS — R3912 Poor urinary stream: Secondary | ICD-10-CM | POA: Diagnosis not present

## 2024-01-02 DIAGNOSIS — I10 Essential (primary) hypertension: Secondary | ICD-10-CM | POA: Diagnosis not present

## 2024-01-02 DIAGNOSIS — E782 Mixed hyperlipidemia: Secondary | ICD-10-CM | POA: Diagnosis not present

## 2024-02-02 DIAGNOSIS — I10 Essential (primary) hypertension: Secondary | ICD-10-CM | POA: Diagnosis not present

## 2024-02-02 DIAGNOSIS — E782 Mixed hyperlipidemia: Secondary | ICD-10-CM | POA: Diagnosis not present

## 2024-03-03 DIAGNOSIS — E782 Mixed hyperlipidemia: Secondary | ICD-10-CM | POA: Diagnosis not present

## 2024-03-03 DIAGNOSIS — I10 Essential (primary) hypertension: Secondary | ICD-10-CM | POA: Diagnosis not present

## 2024-03-10 DIAGNOSIS — R3911 Hesitancy of micturition: Secondary | ICD-10-CM | POA: Diagnosis not present

## 2024-03-10 DIAGNOSIS — R3912 Poor urinary stream: Secondary | ICD-10-CM | POA: Diagnosis not present
